# Patient Record
Sex: Male | Born: 1970 | Race: White | Hispanic: No | Marital: Married | Smoking: Current every day smoker
Health system: Southern US, Community
[De-identification: ages and names within clinical notes are randomized; demographics above are authoritative.]

## PROBLEM LIST (undated history)

## (undated) HISTORY — PX: FEMUR SURGERY: SHX943

## (undated) HISTORY — PX: INNER EAR SURGERY: SHX679

## (undated) HISTORY — PX: COLONOSCOPY: SHX174

---

## 2013-07-05 HISTORY — PX: HERNIA REPAIR: SHX51

## 2013-08-16 ENCOUNTER — Encounter (HOSPITAL_COMMUNITY)
Admission: EM | Disposition: A | Discharge status: Home / Self Care | Payer: Self-pay | Source: Home / Self Care | Attending: Emergency Medicine

## 2013-08-16 ENCOUNTER — Encounter (HOSPITAL_BASED_OUTPATIENT_CLINIC_OR_DEPARTMENT_OTHER): Payer: Self-pay | Admitting: Emergency Medicine

## 2013-08-16 ENCOUNTER — Emergency Department (HOSPITAL_BASED_OUTPATIENT_CLINIC_OR_DEPARTMENT_OTHER): Payer: Worker's Compensation

## 2013-08-16 ENCOUNTER — Inpatient Hospital Stay: Admit: 2013-08-16 | Payer: Self-pay | Admitting: Orthopedic Surgery

## 2013-08-16 ENCOUNTER — Ambulatory Visit (HOSPITAL_BASED_OUTPATIENT_CLINIC_OR_DEPARTMENT_OTHER)
Admission: EM | Admit: 2013-08-16 | Discharge: 2013-08-16 | Disposition: A | Discharge status: Home / Self Care | Payer: Worker's Compensation | Attending: Emergency Medicine | Admitting: Emergency Medicine

## 2013-08-16 ENCOUNTER — Emergency Department (HOSPITAL_COMMUNITY): Payer: Worker's Compensation | Admitting: Anesthesiology

## 2013-08-16 ENCOUNTER — Encounter (HOSPITAL_COMMUNITY): Payer: Worker's Compensation | Admitting: Anesthesiology

## 2013-08-16 DIAGNOSIS — W3189XA Contact with other specified machinery, initial encounter: Secondary | ICD-10-CM | POA: Insufficient documentation

## 2013-08-16 DIAGNOSIS — S67190A Crushing injury of right index finger, initial encounter: Secondary | ICD-10-CM

## 2013-08-16 DIAGNOSIS — Y9269 Other specified industrial and construction area as the place of occurrence of the external cause: Secondary | ICD-10-CM | POA: Insufficient documentation

## 2013-08-16 DIAGNOSIS — Y990 Civilian activity done for income or pay: Secondary | ICD-10-CM | POA: Insufficient documentation

## 2013-08-16 DIAGNOSIS — S6710XA Crushing injury of unspecified finger(s), initial encounter: Secondary | ICD-10-CM | POA: Insufficient documentation

## 2013-08-16 DIAGNOSIS — IMO0002 Reserved for concepts with insufficient information to code with codable children: Secondary | ICD-10-CM | POA: Insufficient documentation

## 2013-08-16 HISTORY — PX: AMPUTATION: SHX166

## 2013-08-16 LAB — BASIC METABOLIC PANEL
BUN: 11 mg/dL (ref 6–23)
CO2: 22 meq/L (ref 19–32)
Calcium: 9.7 mg/dL (ref 8.4–10.5)
Chloride: 103 mEq/L (ref 96–112)
Creatinine, Ser: 0.86 mg/dL (ref 0.50–1.35)
GFR calc Af Amer: >90 mL/min (ref >90)
GLUCOSE: 98 mg/dL (ref 70–99)
POTASSIUM: 4 meq/L (ref 3.7–5.3)
SODIUM: 143 meq/L (ref 137–147)

## 2013-08-16 LAB — CBC
HCT: 44.9 % (ref 39.0–52.0)
Hemoglobin: 16 g/dL (ref 13.0–17.0)
MCH: 32.7 pg (ref 26.0–34.0)
MCHC: 35.6 g/dL (ref 30.0–36.0)
MCV: 91.6 fL (ref 78.0–100.0)
Platelets: 232 10*3/uL (ref 150–400)
RBC: 4.9 MIL/uL (ref 4.22–5.81)
RDW: 12.9 % (ref 11.5–15.5)
WBC: 13.2 10*3/uL — ABNORMAL HIGH (ref 4.0–10.5)

## 2013-08-16 SURGERY — AMPUTATION DIGIT
Anesthesia: Monitor Anesthesia Care | Site: Finger | Laterality: Right

## 2013-08-16 MED ORDER — DEXTROSE 5 % IV SOLN
3.0000 g | INTRAVENOUS | Status: AC
  Administered 2013-08-16: 2 g via INTRAVENOUS

## 2013-08-16 MED ORDER — CEFAZOLIN SODIUM-DEXTROSE 2-3 GM-% IV SOLR
INTRAVENOUS | Status: AC
  Filled 2013-08-16: qty 50

## 2013-08-16 MED ORDER — LIDOCAINE HCL (CARDIAC) 20 MG/ML IV SOLN
INTRAVENOUS | Status: AC
  Filled 2013-08-16: qty 5

## 2013-08-16 MED ORDER — BUPIVACAINE HCL (PF) 0.25 % IJ SOLN
INTRAMUSCULAR | Status: AC
  Filled 2013-08-16: qty 30

## 2013-08-16 MED ORDER — LACTATED RINGERS IV SOLN
INTRAVENOUS | Status: DC | PRN
  Administered 2013-08-16: 17:00:00 via INTRAVENOUS

## 2013-08-16 MED ORDER — CHLORHEXIDINE GLUCONATE 4 % EX LIQD: 60.0000 mL | Freq: Once | CUTANEOUS | Status: DC

## 2013-08-16 MED ORDER — MIDAZOLAM HCL 2 MG/2ML IJ SOLN
INTRAMUSCULAR | Status: AC
  Filled 2013-08-16: qty 2

## 2013-08-16 MED ORDER — OXYCODONE HCL 5 MG PO TABS
5.0000 mg | ORAL_TABLET | Freq: Once | ORAL | Status: AC
  Administered 2013-08-16: 5 mg via ORAL

## 2013-08-16 MED ORDER — MIDAZOLAM HCL 5 MG/5ML IJ SOLN
INTRAMUSCULAR | Status: DC | PRN
  Administered 2013-08-16 (×2): 1 mg via INTRAVENOUS

## 2013-08-16 MED ORDER — SODIUM CHLORIDE 0.9 % IR SOLN
Status: DC | PRN
  Administered 2013-08-16: 2000 mL

## 2013-08-16 MED ORDER — FENTANYL CITRATE 0.05 MG/ML IJ SOLN
INTRAMUSCULAR | Status: AC
  Filled 2013-08-16: qty 5

## 2013-08-16 MED ORDER — CEFAZOLIN SODIUM 1-5 GM-% IV SOLN
1.0000 g | Freq: Once | INTRAVENOUS | Status: AC
  Administered 2013-08-16: 1 g via INTRAVENOUS
  Filled 2013-08-16: qty 50

## 2013-08-16 MED ORDER — HYDROMORPHONE HCL PF 1 MG/ML IJ SOLN
0.5000 mg | Freq: Once | INTRAMUSCULAR | Status: AC
  Administered 2013-08-16: 0.5 mg via INTRAVENOUS
  Filled 2013-08-16: qty 1

## 2013-08-16 MED ORDER — OXYCODONE-ACETAMINOPHEN 5-325 MG PO TABS: ORAL_TABLET | ORAL | Status: DC

## 2013-08-16 MED ORDER — PROPOFOL 10 MG/ML IV BOLUS
INTRAVENOUS | Status: AC
  Filled 2013-08-16: qty 20

## 2013-08-16 MED ORDER — SULFAMETHOXAZOLE-TRIMETHOPRIM 800-160 MG PO TABS: 1.0000 | ORAL_TABLET | Freq: Two times a day (BID) | ORAL | Status: DC

## 2013-08-16 MED ORDER — OXYCODONE HCL 5 MG PO TABS
ORAL_TABLET | ORAL | Status: AC
  Filled 2013-08-16: qty 1

## 2013-08-16 MED ORDER — PROPOFOL 10 MG/ML IV EMUL
INTRAVENOUS | Status: AC
  Filled 2013-08-16: qty 50

## 2013-08-16 MED ORDER — FENTANYL CITRATE 0.05 MG/ML IJ SOLN
INTRAMUSCULAR | Status: DC | PRN
  Administered 2013-08-16 (×5): 50 ug via INTRAVENOUS

## 2013-08-16 MED ORDER — BUPIVACAINE HCL (PF) 0.25 % IJ SOLN
INTRAMUSCULAR | Status: DC | PRN
  Administered 2013-08-16: 10 mL

## 2013-08-16 MED ORDER — LACTATED RINGERS IV SOLN
INTRAVENOUS | Status: DC
  Administered 2013-08-16: 17:00:00 via INTRAVENOUS

## 2013-08-16 MED ORDER — ONDANSETRON HCL 4 MG/2ML IJ SOLN
4.0000 mg | Freq: Once | INTRAMUSCULAR | Status: DC | PRN
Start: 2013-08-16 — End: 2013-08-16

## 2013-08-16 MED ORDER — PROPOFOL INFUSION 10 MG/ML OPTIME
INTRAVENOUS | Status: DC | PRN
  Administered 2013-08-16: 120 ug/kg/min via INTRAVENOUS

## 2013-08-16 MED ORDER — ROCURONIUM BROMIDE 50 MG/5ML IV SOLN
INTRAVENOUS | Status: AC
  Filled 2013-08-16: qty 1

## 2013-08-16 MED ORDER — ONDANSETRON HCL 4 MG/2ML IJ SOLN
INTRAMUSCULAR | Status: DC | PRN
  Administered 2013-08-16: 4 mg via INTRAVENOUS

## 2013-08-16 MED ORDER — HYDROMORPHONE HCL PF 1 MG/ML IJ SOLN: 0.2500 mg | INTRAMUSCULAR | Status: DC | PRN

## 2013-08-16 SURGICAL SUPPLY — 52 items
BANDAGE GAUZE ELAST BULKY 4 IN (GAUZE/BANDAGES/DRESSINGS) IMPLANT
BLADE LONG MED 31MMX9MM (MISCELLANEOUS)
BLADE LONG MED 31X9 (MISCELLANEOUS) IMPLANT
BNDG COHESIVE 1X5 TAN STRL LF (GAUZE/BANDAGES/DRESSINGS) ×3 IMPLANT
BNDG COHESIVE 3X5 TAN STRL LF (GAUZE/BANDAGES/DRESSINGS) IMPLANT
BNDG ESMARK 4X9 LF (GAUZE/BANDAGES/DRESSINGS) ×3 IMPLANT
CLOTH BEACON ORANGE TIMEOUT ST (SAFETY) IMPLANT
CORDS BIPOLAR (ELECTRODE) ×3 IMPLANT
COVER SURGICAL LIGHT HANDLE (MISCELLANEOUS) ×3 IMPLANT
CUFF TOURNIQUET SINGLE 18IN (TOURNIQUET CUFF) ×3 IMPLANT
CUFF TOURNIQUET SINGLE 24IN (TOURNIQUET CUFF) IMPLANT
DRAIN PENROSE 1/4X12 LTX STRL (WOUND CARE) ×3 IMPLANT
DRSG KUZMA FLUFF (GAUZE/BANDAGES/DRESSINGS) IMPLANT
DRSG PAD ABDOMINAL 8X10 ST (GAUZE/BANDAGES/DRESSINGS) ×3 IMPLANT
DURAPREP 26ML APPLICATOR (WOUND CARE) IMPLANT
GAUZE XEROFORM 1X8 LF (GAUZE/BANDAGES/DRESSINGS) ×3 IMPLANT
GLOVE BIO SURGEON STRL SZ 6.5 (GLOVE) IMPLANT
GLOVE BIO SURGEONS STRL SZ 6.5 (GLOVE)
GLOVE BIOGEL PI IND STRL 6.5 (GLOVE) ×1 IMPLANT
GLOVE BIOGEL PI IND STRL 7.0 (GLOVE) ×1 IMPLANT
GLOVE BIOGEL PI INDICATOR 6.5 (GLOVE) ×2
GLOVE BIOGEL PI INDICATOR 7.0 (GLOVE) ×2
GLOVE SURG ORTHO 8.0 STRL STRW (GLOVE) ×3 IMPLANT
GOWN BRE IMP PREV XXLGXLNG (GOWN DISPOSABLE) IMPLANT
GOWN PREVENTION PLUS XLARGE (GOWN DISPOSABLE) ×3 IMPLANT
GOWN STRL NON-REIN LRG LVL3 (GOWN DISPOSABLE) ×3 IMPLANT
KIT BASIN OR (CUSTOM PROCEDURE TRAY) ×3 IMPLANT
KIT ROOM TURNOVER OR (KITS) ×3 IMPLANT
MANIFOLD NEPTUNE II (INSTRUMENTS) IMPLANT
NEEDLE HYPO 25GX1X1/2 BEV (NEEDLE) ×3 IMPLANT
NS IRRIG 1000ML POUR BTL (IV SOLUTION) ×3 IMPLANT
PACK ORTHO EXTREMITY (CUSTOM PROCEDURE TRAY) ×3 IMPLANT
PAD ARMBOARD 7.5X6 YLW CONV (MISCELLANEOUS) ×6 IMPLANT
PAD CAST 4YDX4 CTTN HI CHSV (CAST SUPPLIES) IMPLANT
PADDING CAST COTTON 4X4 STRL (CAST SUPPLIES)
RUBBERBAND STERILE (MISCELLANEOUS) IMPLANT
SET CYSTO W/LG BORE CLAMP LF (SET/KITS/TRAYS/PACK) ×3 IMPLANT
SPECIMEN JAR SMALL (MISCELLANEOUS) IMPLANT
SPLINT FINGER (SOFTGOODS) ×3 IMPLANT
SPONGE GAUZE 4X4 12PLY (GAUZE/BANDAGES/DRESSINGS) ×3 IMPLANT
SPONGE SCRUB IODOPHOR (GAUZE/BANDAGES/DRESSINGS) ×3 IMPLANT
SUT ETHILON 5 0 PS 2 18 (SUTURE) IMPLANT
SUT MON AB 5-0 P3 18 (SUTURE) ×3 IMPLANT
SUT SILK 4 0 PS 2 (SUTURE) IMPLANT
SUT VICRYL 4-0 PS2 18IN ABS (SUTURE) IMPLANT
SYR CONTROL 10ML LL (SYRINGE) ×3 IMPLANT
TOWEL OR 17X24 6PK STRL BLUE (TOWEL DISPOSABLE) ×3 IMPLANT
TOWEL OR 17X26 10 PK STRL BLUE (TOWEL DISPOSABLE) ×3 IMPLANT
TUBE CONNECTING 12'X1/4 (SUCTIONS) ×1
TUBE CONNECTING 12X1/4 (SUCTIONS) ×2 IMPLANT
UNDERPAD 30X30 INCONTINENT (UNDERPADS AND DIAPERS) ×3 IMPLANT
WATER STERILE IRR 1000ML POUR (IV SOLUTION) IMPLANT

## 2013-08-16 NOTE — ED Notes (Signed)
Pt amb to room 5 with quick steady gait in nad. Pt reports crush injury at work, paper towel removed from right second digit to reveal a partial amputation of the tip, with moderate active bleeding noted. Pt rates pain at 4/10 "it just throbs a little bit."

## 2013-08-16 NOTE — Anesthesia Postprocedure Evaluation (Signed)
  Anesthesia Post-op Note  Patient: Gregory Peters  Procedure(s) Performed: Procedure(s): INDEX REVISION AMPUTATION DIGIT (Right)  Patient Location: PACU  Anesthesia Type:MAC  Level of Consciousness: awake, alert , oriented and patient cooperative  Airway and Oxygen Therapy: Patient Spontanous Breathing  Post-op Pain: none  Post-op Assessment: Post-op Vital signs reviewed, Patient's Cardiovascular Status Stable, Respiratory Function Stable, Patent Airway, No signs of Nausea or vomiting and Pain level controlled  Post-op Vital Signs: stable  Complications: No apparent anesthesia complications

## 2013-08-16 NOTE — Anesthesia Preprocedure Evaluation (Addendum)
Anesthesia Evaluation  Patient identified by MRN, date of birth, ID band Patient awake    Reviewed: Allergy & Precautions, H&P , NPO status , Patient's Chart, lab work & pertinent test results  Airway       Dental   Pulmonary          Cardiovascular     Neuro/Psych    GI/Hepatic   Endo/Other    Renal/GU      Musculoskeletal   Abdominal   Peds  Hematology   Anesthesia Other Findings   Reproductive/Obstetrics                           Anesthesia Physical Anesthesia Plan  ASA: I  Anesthesia Plan: MAC   Post-op Pain Management:    Induction: Intravenous  Airway Management Planned: Mask  Additional Equipment:   Intra-op Plan:   Post-operative Plan:   Informed Consent: I have reviewed the patients History and Physical, chart, labs and discussed the procedure including the risks, benefits and alternatives for the proposed anesthesia with the patient or authorized representative who has indicated his/her understanding and acceptance.     Plan Discussed with:   Anesthesia Plan Comments:         Anesthesia Quick Evaluation

## 2013-08-16 NOTE — Op Note (Signed)
[id number] 

## 2013-08-16 NOTE — Preoperative (Signed)
Beta Blockers   Reason not to administer Beta Blockers:Not Applicable 

## 2013-08-16 NOTE — ED Notes (Signed)
IV left in place per MD order for patient to be transferred to [facility].

## 2013-08-16 NOTE — Discharge Instructions (Signed)

## 2013-08-16 NOTE — H&P (Signed)
  Lana FishMatthew Rehman is an 43 y.o. male.   Chief Complaint: right index finger degloving HPI: 43 yo rhd male states he caught right index finger in rollers in machine at work this afternoon.  End of right index finger removed by rollers.  [facility] and [facility].  Reports no previous injury to right index and no other injury at this time.  History reviewed. No pertinent past medical history.  Past Surgical History  Procedure Laterality Date  . Femur surgery Right     Age 75  . Inner ear surgery Left   . Colonoscopy      History reviewed. No pertinent family history. Social History:  reports that he has never smoked. His smokeless tobacco use includes Chew. He reports that he drinks about 1.2 ounces of alcohol per week. He reports that he does not use illicit drugs.  Allergies: No Known Allergies  Medications Prior to Admission  Medication Sig Dispense Refill  . ibuprofen (ADVIL,MOTRIN) 200 MG tablet Take 200 mg by mouth every 6 (six) hours as needed.        No results found for this or any previous visit (from the past 48 hour(s)).  Dg Hand Complete Right  08/16/2013   CLINICAL DATA:  Crush injury to the right index finger.  EXAM: RIGHT HAND - COMPLETE 3+ VIEW  COMPARISON:  None.  FINDINGS: Amputation of the soft tissues of the right index finger tipped noted. Thin rim of soft tissues around the tuft of the distal phalanx. Questionable scalloping of the distal tuft may reflect a small fracture of the tip of the tuft.  No other acute bony findings. Scattered small degenerative subcortical cysts or geodes in the carpus.  IMPRESSION: 1. Index finger tip amputation, with some degloving of the soft tissues around the tuft. Possible tiny bony defect in the distal rim of the tuft may reflect a small fracture with loss of the distal rim fragment.   Electronically Signed   By: Herbie BaltimoreWalt  Liebkemann M.D.   On: 08/16/2013 14:51     A comprehensive review of systems was  negative.  Blood pressure 155/100, pulse 87, temperature 98.3 F (36.8 C), temperature source Oral, resp. rate 20, height 5\' 8"  (1.727 m), weight 285 lb (129.275 kg), SpO2 98.00%.  General appearance: alert, cooperative and appears stated age Head: Normocephalic, without obvious abnormality, atraumatic Neck: supple, symmetrical, trachea midline Resp: clear to auscultation bilaterally Cardio: regular rate and rhythm GI: non tender Extremities: intact sensation and capillary refill all digits except right index.  +epl/fpl/io.  right index with soft tissue loss from base of nail distally.  nail fold remains.  bone exposed.  no other wounds.  +F/E at dip of index. Pulses: 2+ and symmetric Skin: Skin color, texture, turgor normal. No rashes or lesions Neurologic: Grossly normal Incision/Wound: As above  Assessment/Plan Right index finger amputation/degloving injury.  Recommend revision amputation in OR.  Risks, benefits, and alternatives of surgery were discussed and the patient agrees with the plan of care.   Joniah Bednarski R 08/16/2013, 5:38 PM

## 2013-08-16 NOTE — ED Provider Notes (Signed)
CSN: [id number]     Arrival date & time 08/16/13  1352 History   First MD Initiated Contact with Patient 08/16/13 1413     Chief Complaint  Patient presents with  . partial amputation, crush injury      (Consider location/radiation/quality/duration/timing/severity/associated sxs/prior Treatment) Patient is a 43 y.o. male presenting with hand injury. The history is provided by the patient.  Hand Injury Location:  Finger Time since incident:  1 hour Injury: yes   Mechanism of injury: crush   Finger location:  R index finger Pain details:    Quality:  Aching   Radiates to:  Does not radiate   Severity:  Mild   Onset quality:  Sudden   Duration:  1 hour   Timing:  Constant   Progression:  Unchanged Chronicity:  New Handedness:  Right-handed Dislocation: no   Foreign body present:  No foreign bodies Prior injury to area:  No Relieved by:  Nothing Worsened by:  Nothing tried Ineffective treatments:  None tried Associated symptoms: no fever and no neck pain     History reviewed. No pertinent past medical history. History reviewed. No pertinent past surgical history. History reviewed. No pertinent family history. History  Substance Use Topics  . Smoking status: Never Smoker   . Smokeless tobacco: Current User  . Alcohol Use: Not on file    Review of Systems  Constitutional: Negative for fever.  HENT: Negative for drooling and rhinorrhea.   Eyes: Negative for pain.  Respiratory: Negative for cough and shortness of breath.   Cardiovascular: Negative for chest pain and leg swelling.  Gastrointestinal: Negative for nausea, vomiting, abdominal pain and diarrhea.  Genitourinary: Negative for dysuria and hematuria.  Musculoskeletal: Negative for gait problem and neck pain.  Skin: Negative for color change.  Neurological: Negative for numbness and headaches.  Hematological: Negative for adenopathy.  Psychiatric/Behavioral: Negative for behavioral problems.  All other  systems reviewed and are negative.      Allergies  Review of patient's allergies indicates no known allergies.  Home Medications  No current outpatient prescriptions on file. There were no vitals taken for this visit. Physical Exam  Nursing note and vitals reviewed. Constitutional: He is oriented to person, place, and time. He appears well-developed and well-nourished.  HENT:  Head: Normocephalic and atraumatic.  Right Ear: External ear normal.  Left Ear: External ear normal.  Nose: Nose normal.  Mouth/Throat: Oropharynx is clear and moist. No oropharyngeal exudate.  Eyes: Conjunctivae and EOM are normal. Pupils are equal, round, and reactive to light.  Neck: Normal range of motion. Neck supple.  Cardiovascular: Normal rate, regular rhythm, normal heart sounds and intact distal pulses.  Exam reveals no gallop and no friction rub.   No murmur heard. Pulmonary/Chest: Effort normal and breath sounds normal. No respiratory distress. He has no wheezes.  Abdominal: Soft. Bowel sounds are normal. He exhibits no distension. There is no tenderness. There is no rebound and no guarding.  Musculoskeletal: Normal range of motion. He exhibits no edema and no tenderness.       Hands: Sensation and motor skills intact in the right hand.  2+ distal pulses in the right upper extremity.  Neurological: He is alert and oriented to person, place, and time.  Skin: Skin is warm and dry.  Psychiatric: He has a normal mood and affect. His behavior is normal.    ED Course  Procedures (including critical care time) Labs Review Labs Reviewed - No data to display Imaging Review No  results found.  EKG Interpretation   None           MDM   Final diagnoses:  Crushing injury of right index finger    2:21 PM 43 y.o. male who presents with a partial amputation injury of his right index finger which occurred prior to arrival. The patient states that he was wearing a Kevlar gloves and got his  finger stuck in the rolling pins of a machine leading to a crush/amputation of the distal aspect of the digit. He is afebrile here and vital signs are unremarkable. He currently has 4/10 pain. He refuses any pain medicine currently. He is right handed. He states that his tetanus is up-to-date within the last 4 years.  Will give ancef 1g IV. Possible tuft fracture noted.   4:00 PM Discussed w/ Dr. Merlyn Lot who will see the patient at [facility] ED. Informed pod B physician Dr. Redgie Grayer. Pt is reliable and wound has been hemostatic since he has been here. Will dress wound and send pt via private vehicle to [facility].   Junius Argyle, MD 08/16/13 [phone]

## 2013-08-16 NOTE — Brief Op Note (Signed)
08/16/2013  6:43 PM  PATIENT:  Gregory FishMatthew Peters  43 y.o. male  PRE-OPERATIVE DIAGNOSIS:  Right index finger amputation /degloving  POST-OPERATIVE DIAGNOSIS:  Right index finger amputation/degloving  PROCEDURE:  Procedure(s): INDEX REVISION AMPUTATION DIGIT (Right)  SURGEON:  Surgeon(s) and Role:    * Tami RibasKevin R Jaelin Devincentis, MD - Primary  PHYSICIAN ASSISTANT:   ASSISTANTS: none   ANESTHESIA:   local and MAC  EBL:  Total I/O In: 700 [I.V.:700] Out: -   BLOOD ADMINISTERED:none  DRAINS: none   LOCAL MEDICATIONS USED:  MARCAINE     SPECIMEN:  No Specimen  DISPOSITION OF SPECIMEN:  N/A  COUNTS:  YES  TOURNIQUET:   Total Tourniquet Time Documented: area (Right) - 27 minutes Total: area (Right) - 27 minutes   DICTATION: .Other Dictation: Dictation Number [id number]  PLAN OF CARE: Discharge to home after PACU  PATIENT DISPOSITION:  PACU - hemodynamically stable.

## 2013-08-16 NOTE — Transfer of Care (Signed)
Immediate Anesthesia Transfer of Care Note  Patient: Gregory FishMatthew Peters  Procedure(s) Performed: Procedure(s): INDEX REVISION AMPUTATION DIGIT (Right)  Patient Location: PACU  Anesthesia Type:MAC  Level of Consciousness: awake, alert  and oriented  Airway & Oxygen Therapy: Patient Spontanous Breathing and Patient connected to nasal cannula oxygen  Post-op Assessment: Report given to PACU RN and Post -op Vital signs reviewed and stable  Post vital signs: Reviewed and stable  Complications: No apparent anesthesia complications

## 2013-08-17 ENCOUNTER — Encounter (HOSPITAL_COMMUNITY): Payer: Self-pay | Admitting: Orthopedic Surgery

## 2013-08-17 NOTE — Op Note (Signed)
NAMParticia Peters:  Few, Jeanpierre                ACCOUNT NO.:  [id number]  MEDICAL RECORD NO.:  [id number]  LOCATION:  [facility]                         FACILITY:  [facility]  PHYSICIAN:  Betha LoaKevin Izabellah Dadisman, MD        DATE OF BIRTH:  [birth year]  DATE OF PROCEDURE:  08/16/2013 DATE OF DISCHARGE:  08/16/2013                              OPERATIVE REPORT   PREOPERATIVE DIAGNOSIS:  Right index finger amputation/degloving.  POSTOPERATIVE DIAGNOSIS:  Right index finger amputation/degloving.  PROCEDURE:  Revision amputation right index finger.  SURGEON:  Betha LoaKevin Paisly Fingerhut, MD  ASSISTANT:  None.  ANESTHESIA:  MAC with digital block.  IV FLUIDS:  Per Anesthesia flow sheet.  ESTIMATED BLOOD LOSS:  Minimal.  COMPLICATIONS:  None.  SPECIMENS:  None.  TOURNIQUET TIME:  Twenty seven minutes.  DISPOSITION:  Stable to PACU.  INDICATIONS:  Mr. Gregory Peters is a 43 year old right-hand dominant male who worked today, got his right index finger caught in a machine with a rollers on it.  This pinched off the tip of the finger.  [facility], where he was evaluated and found to have a amputation/degloving injury of the distal aspect of the right index finger.  [facility].  I discussed with Mr. Gregory Peters and his significant other the nature of the injury, recommended revision amputation in the operating room.  Risks, benefits, and alternatives of surgery were discussed including risk of blood loss, infection, damage to nerves, vessels, tendons, ligaments, bone; failure of surgery; need for additional surgery, complications with wound healing, continued pain, and weakness.  He voiced understanding of these risks and elected to proceed.  OPERATIVE COURSE:  After being identified preoperatively by myself, the patient and I agreed upon procedure and site procedure.  Surgical site was marked.  The risks, benefits, and alternatives of surgery were reviewed and wished to proceed.   Surgical consent had been signed.  His tetanus has been within the last 4 years.  He was given IV Ancef as preoperative antibiotic coverage.  He was transferred to the operating room and placed on the operating table in supine position with the right upper extremity on arm board.  General MAC anesthesia was induced by anesthesiologist.  The right upper extremity was prepped and draped in normal sterile orthopedic fashion.  Surgical pause was performed between surgeons, anesthesia, operating staff, and all were in agreement as to the patient, procedure, and site of procedure.  Digital block was performed with 10 mL of 0.25% plain Marcaine to aid in postoperative analgesia and as anesthesia for the case.  Once adequate digital block had been obtained, a Penrose drain was placed in the proximal aspect of the finger as a tourniquet was up for 27 minutes.  The wound was evaluated.  There was soft tissue loss both volarly and dorsally.  Soft tissue loss volarly was to approximate the DIP flexion crease.  There was some subcutaneous tissue remaining.  The radial and ulnar neurovascular bundles were identified and treated with bipolar electrocautery and are allowed to retract.  The wound was copiously irrigated with 2000 mL of sterile saline by cysto tubing.  The  nail fold was removed sharply with the knife.  The bone was shortened with a bone cutters and rongeurs to the point that soft tissue coverage could be obtained.  This allowed preservation of the FDP and extensor tendon insertions.  It was felt that this would allow increased functional use of the finger.  The soft tissues were mobilized to allow soft tissue coverage over the end of the bone.  The wound was closed with 5-0 Monocryl suture in interrupted fashion.  Good bony coverage had been obtained.  The wound was dressed with sterile Xeroform and, 4x4 and wrapped with a Coban dressing lightly.  An AlumaFoam splint was placed and wrapped  lightly with Coban dressing as well.  The Penrose drain was removed.  The operative drapes were broken down.  The patient was awoken from his anesthesia safely.  He was transferred back to stretcher and taken to PACU in stable condition.  I will see him back in the office in 1 week for postoperative followup.  I will give him Percocet 5/325, 1-2 p.o. q.6 hours p.r.n. pain, dispensed #40, and Bactrim DS 1 p.o. b.i.d. x7 days.     Betha Loa, MD     KK/MEDQ  D:  08/16/2013  T:  08/17/2013  Job:  [id number]

## 2014-05-17 ENCOUNTER — Encounter: Payer: Self-pay | Admitting: Physician Assistant

## 2014-05-17 ENCOUNTER — Ambulatory Visit (INDEPENDENT_AMBULATORY_CARE_PROVIDER_SITE_OTHER): Payer: Managed Care, Other (non HMO) | Admitting: Physician Assistant

## 2014-05-17 VITALS — BP 123/65 | HR 85 | Ht 69.0 in | Wt 292.0 lb

## 2014-05-17 DIAGNOSIS — Z131 Encounter for screening for diabetes mellitus: Secondary | ICD-10-CM

## 2014-05-17 DIAGNOSIS — Z13220 Encounter for screening for lipoid disorders: Secondary | ICD-10-CM

## 2014-05-17 DIAGNOSIS — F17200 Nicotine dependence, unspecified, uncomplicated: Secondary | ICD-10-CM

## 2014-05-17 DIAGNOSIS — E669 Obesity, unspecified: Secondary | ICD-10-CM

## 2014-05-17 DIAGNOSIS — R5383 Other fatigue: Secondary | ICD-10-CM

## 2014-05-20 DIAGNOSIS — F17200 Nicotine dependence, unspecified, uncomplicated: Secondary | ICD-10-CM | POA: Insufficient documentation

## 2014-05-20 DIAGNOSIS — E669 Obesity, unspecified: Secondary | ICD-10-CM | POA: Insufficient documentation

## 2014-05-20 DIAGNOSIS — R5383 Other fatigue: Secondary | ICD-10-CM | POA: Insufficient documentation

## 2014-05-20 NOTE — Progress Notes (Signed)
   Subjective:    Patient ID: Gregory Peters, male    DOB: 07/11/1970, 43 y.o.   MRN: [id number]  HPI Patient is a 43 year old male who presents to the clinic to establish care.  Patient has no ongoing past medical history and on no current medications.  .. Family History  Problem Relation Age of Onset  . Cancer Father     bladder  . Diabetes Father    .[name] Kitchen. History   Social History  . Marital Status: Married    Spouse Name: N/A    Number of Children: N/A  . Years of Education: N/A   Occupational History  . Not on file.   Social History Main Topics  . Smoking status: Current Every Day Smoker  . Smokeless tobacco: Current User    Types: Chew  . Alcohol Use: 1.2 oz/week    2 Cans of beer per week     Comment: daily  . Drug Use: No  . Sexual Activity: Yes   Other Topics Concern  . Not on file   Social History Narrative   Patient is occurring every day smoker and does not exercise.  Patient is concerned due to his ongoing fatigue and weight. He admits he does not eat right does not exercise.   Review of Systems  All other systems reviewed and are negative.      Objective:   Physical Exam  Constitutional: He is oriented to person, place, and time. He appears well-developed and well-nourished.  Obese.   HENT:  Head: Normocephalic and atraumatic.  Neck: Normal range of motion. Neck supple. No thyromegaly present.  Cardiovascular: Normal rate, regular rhythm and normal heart sounds.   Pulmonary/Chest: Effort normal and breath sounds normal. He has no wheezes.  Neurological: He is alert and oriented to person, place, and time.  Skin: Skin is dry.  Psychiatric: He has a normal mood and affect. His behavior is normal.          Assessment & Plan:  Fatigue/obese- will order labs to evaluate fatigue. Certainly being overweight and not active could be causing some fatigue. Cannot rule out sleep apnea due to size. Patient would like to try to lose weight and see if  some of the symptoms improve. Discuss going on phentermine for a trial. He would like to try on his own for a few months and then consider medication.  Tobacco dependence-discuss quitting smoking patient is not opposed to the idea but not ready to quit right the second.

## 2014-10-25 ENCOUNTER — Ambulatory Visit: Payer: Self-pay | Admitting: Physician Assistant

## 2015-10-13 ENCOUNTER — Encounter: Payer: Self-pay | Admitting: Physician Assistant

## 2015-10-13 ENCOUNTER — Ambulatory Visit (INDEPENDENT_AMBULATORY_CARE_PROVIDER_SITE_OTHER): Payer: BLUE CROSS/BLUE SHIELD | Admitting: Physician Assistant

## 2015-10-13 VITALS — BP 119/64 | HR 67 | Ht 69.0 in | Wt 273.0 lb

## 2015-10-13 DIAGNOSIS — M545 Low back pain, unspecified: Secondary | ICD-10-CM

## 2015-10-13 MED ORDER — CYCLOBENZAPRINE HCL 10 MG PO TABS: 10.0000 mg | ORAL_TABLET | Freq: Three times a day (TID) | ORAL | Status: DC | PRN

## 2015-10-13 MED ORDER — KETOROLAC TROMETHAMINE 60 MG/2ML IM SOLN
60.0000 mg | Freq: Once | INTRAMUSCULAR | Status: AC
  Administered 2015-10-13: 60 mg via INTRAMUSCULAR

## 2015-10-13 MED ORDER — NAPROXEN-ESOMEPRAZOLE 500-20 MG PO TBEC: 1.0000 | DELAYED_RELEASE_TABLET | Freq: Two times a day (BID) | ORAL | Status: DC | PRN

## 2015-10-13 NOTE — Patient Instructions (Signed)
biofreeze

## 2015-10-13 NOTE — Progress Notes (Signed)
   Subjective:    Patient ID: Gregory FishMatthew Stines, male    DOB: 01/17/1971, 45 y.o.   MRN: [id number]  HPI  Pt is a 45 yo male who presents to the clinic with right low to mid back pain that does not radiate for the last 3 days. Hx of muscle tightness, spasms and pain that comes and goes. Usually ibuprofen helps signifcantly and he gets better. This injury has lasted longer. Denies any known trauma. No recent lifting or pulling. Does not exercise. Happened after twisting wrong. Twisting movements make worse. Not bowel or bladder dysfunction or saddle anesthesia. Ibuprofen does help. No fever, chills, or urinary symptoms.    Review of Systems    see HPI. Objective:   Physical Exam  Constitutional: He appears well-developed and well-nourished.  Pulmonary/Chest: Effort normal and breath sounds normal.  NO CVA tenderness.   Musculoskeletal:  Full ROM at waist with little discomfort.  Pain with twisting motion and side to side.  No tenderness over spine to palpation but tenderness over right lower rhomboid,latissimus dorsi and into lower back.  Negative straight leg test bilaterally.  Normal 5/5 strength of bilateral legs.   Psychiatric: He has a normal mood and affect. His behavior is normal.          Assessment & Plan:  Right low back pain- suspect muscle spasm/sprain. toradol 60mg  given in office today. Sample of vimovo with rx sent to pharmacy. Flexeril as needed. Discussed heat with exercises given in office today and then ice and put biofreeze over painful areas. Follow up if worsening or not improving.

## 2015-10-15 DIAGNOSIS — M545 Low back pain, unspecified: Secondary | ICD-10-CM | POA: Insufficient documentation

## 2016-07-30 ENCOUNTER — Encounter: Payer: Self-pay | Admitting: Family Medicine

## 2016-07-30 ENCOUNTER — Ambulatory Visit (INDEPENDENT_AMBULATORY_CARE_PROVIDER_SITE_OTHER): Payer: Worker's Compensation

## 2016-07-30 ENCOUNTER — Ambulatory Visit (INDEPENDENT_AMBULATORY_CARE_PROVIDER_SITE_OTHER): Payer: 59 | Admitting: Family Medicine

## 2016-07-30 DIAGNOSIS — S59901A Unspecified injury of right elbow, initial encounter: Secondary | ICD-10-CM | POA: Insufficient documentation

## 2016-07-30 DIAGNOSIS — S46211A Strain of muscle, fascia and tendon of other parts of biceps, right arm, initial encounter: Secondary | ICD-10-CM | POA: Diagnosis not present

## 2016-07-30 DIAGNOSIS — X500XXA Overexertion from strenuous movement or load, initial encounter: Secondary | ICD-10-CM

## 2016-07-30 DIAGNOSIS — Z77018 Contact with and (suspected) exposure to other hazardous metals: Secondary | ICD-10-CM | POA: Diagnosis not present

## 2016-07-30 NOTE — Progress Notes (Signed)
   Subjective:    I'm seeing this patient as a consultation for:  Gregory GawJade Breeback, PA-C   CC: Right elbow injury  HPI: Patient has significant right anterior elbow pain. He was lifting a heavy object at home and felt a pop. He notes intense pain at the anterior elbow and has swelling on his right upper arm. He is concerned that he may have ruptured his distal biceps tendon. He notes significant pain with elbow extension, resisted elbow flexion and supination. He has not tried any medications yet. He has used a shoulder sling but tends to help a little. The injury occurred yesterday. He is right-hand dominant and works as a Haematologiststeel worker. He thinks he may have had exposure to metal in his eyes before.  Past medical history, Surgical history, Family history not pertinant except as noted below, Social history, Allergies, and medications have been entered into the medical record, reviewed, and no changes needed.   Review of Systems: No headache, visual changes, nausea, vomiting, diarrhea, constipation, dizziness, abdominal pain, skin rash, fevers, chills, night sweats, weight loss, swollen lymph nodes, body aches, joint swelling, muscle aches, chest pain, shortness of breath, mood changes, visual or auditory hallucinations.   Objective:    Vitals:   07/30/16 1050  BP: 117/61  Pulse: 76   General: Well Developed, well nourished, and in no acute distress.  Neuro/Psych: Alert and oriented x3, extra-ocular muscles intact, able to move all 4 extremities, sensation grossly intact. Skin: Warm and dry, no rashes noted.  Respiratory: Not using accessory muscles, speaking in full sentences, trachea midline.  Cardiovascular: Pulses palpable, no extremity edema. Abdomen: Does not appear distended. MSK: Right elbow with defect visible at the distal biceps and retracted large swelling at the proximal biceps. Tender to palpation at the anterior elbow with absent an abnormal TEST. Patient has significant pain  with elbow extension and resisted elbow flexion and supination. Range of motion is approximately 70-130. Pulses capillary refill and sensation are intact distally.  X-ray right elbow and foreign body eyes are unremarkable to my interpretation. Awaiting formal radiology review  No results found for this or any previous visit (from the past 24 hour(s)). No results found.  Impression and Recommendations:    Assessment and Plan: 46 y.o. male with Suspected right distal biceps rupture. Patient was placed in a posterior arm splint and will be scheduled for MRI as soon as possible.. I will contact patient when MRI results are available I will discuss treatment options.   Discussed warning signs or symptoms. Please see discharge instructions. Patient expresses understanding.

## 2016-07-30 NOTE — Patient Instructions (Signed)
Thank you for coming in today. I suspect distal biceps tendon tear.  You should hear soon about MRI time Saturday.  Follow up with me a few days after MRI.  I will contact you sooner if needed.    Biceps Tendon Disruption (Distal) The distal biceps tendon is a strong cord of tissue that connects the biceps muscle, on the front of the upper arm, to a bone (radius) in the elbow. A distal biceps tendon disruption can interfere with the ability to turn the hand palm-up (supination) and bend (flex) the elbow. This is an uncommon injury that may include one or both of the following:  A complete tear (rupture) in the tendon, causing the tendon to completely separate from the radius. When this happens, the biceps muscle pulls up (retracts) toward the shoulder.  A partial tear in the tendon that causes the tendon to partially separate from the radius. This is less common than a tendon rupture. This condition is commonly treated with surgery, and recovery may take 4-8 months. Your health care provider will decide when it is safe for you to return to contact sports. What are the causes? This condition is usually caused by suddenly straightening the elbow while the biceps muscle is tightened (contracted). This could happen, for example, while lifting or carrying a heavy object that suddenly falls or shifts position. What increases the risk? The following factors may increase your risk of developing this condition:  Being a man who is 89-108 years old.  Smoking.  Using steroids. What are the signs or symptoms? Symptoms of this condition may include:  Feeling a "pop" in the elbow at the time of injury.  Pain, swelling, and bruising in the front of the elbow.  Weakness in the arm when doing certain movements, such as:  Lifting or carrying objects.  Rotating the forearm, such as when you turn a screwdriver.  Bending the elbow.  A bulge in the upper arm. You may feel this in the front of the  arm, the inside of the arm, or both.  Limited range of motion of the elbow and forearm. How is this diagnosed? This condition is diagnosed based on your symptoms, your medical history, and a physical exam. Your health care provider may test your range of motion by having you do arm movements. You may have tests, including:  X-rays.  Ultrasound. This uses sound waves to make an image of the affected area.  MRI. How is this treated? Treatment for this condition usually includes one or more of the following:  Resting from sports and intense physical activity.  Physical therapy.  Surgery to reattach your tendon to your radius. This is the most common treatment method. Your elbow will be kept in place (immobilized) with a cast immediately after surgery.  A splint or brace to immobilize your elbow. If you had surgery and you received a cast after surgery, you will get a splint or brace to replace your cast when you start physical therapy. In some cases, no treatment may be needed for this condition. Follow these instructions at home: If you have a splint or brace:  Wear the splint or brace as told by your health care provider. Remove it only as told by your health care provider.  Loosen the splint or brace if your fingers tingle, become numb, or turn cold and blue.  Do not let your splint or brace get wet if it is not waterproof.  If you have a splint or brace,  do not take baths, swim, or use a hot tub until your health care provider approves. Ask your health care provider if you can take showers. You may only be allowed to take sponge baths for bathing.  If your splint or brace is not waterproof, cover it with a watertight covering when you take a bath or a shower.  Keep the splint or brace clean. If you have a cast:  Do not stick anything inside the cast to scratch your skin. Doing that increases your risk of infection.  Check the skin around the cast every day. Tell your health  care provider about any concerns.  You may put lotion on dry skin around the edges of the cast. Do not put lotion on the skin underneath the cast.  Keep the cast clean.  If the cast is not waterproof:  Do not let it get wet.  Cover it with a watertight covering when you take a bath or a shower. Managing pain, stiffness, and swelling  If directed, put ice on the injured area:  Put ice in a plastic bag.  Place a towel between your skin and the bag.  Leave the ice on for 20 minutes, 2-3 times a day.  If directed, apply heat to the affected area before you exercise or as often as told by your health care provider. Use the heat source that your health care provider recommends, such as a moist heat pack or a heating pad.  Place a towel between your skin and the heat source.  Leave the heat on for 20-30 minutes.  Remove the heat if your skin turns bright red. This is especially important if you are unable to feel pain, heat, or cold. You may have a greater risk of getting burned.  Move your fingers often to avoid stiffness and to lessen swelling.  Raise (elevate) the injured area above the level of your heart while you are sitting or lying down. Driving  Do not drive or operate heavy machinery while taking prescription pain medicine.  Ask your health care provider when it is safe to drive if you have a cast, brace, splint, or sling on your arm. Activity  Return to your normal activities as told by your health care provider. Ask your health care provider what activities are safe for you.  Avoid activities that cause pain or make your condition worse.  Do not participate in contact sports until your health care provider approves.  Do not lift or carry anything with your injured arm until your health care provider approves.  Do exercises as told by your health care provider. Safety  Do not use the affected limb to support your body weight until your health care provider says  that you can. General instructions  Take over-the-counter and prescription medicines only as told by your health care provider.  If you have a cast or splint, do not put pressure on any part of the cast or splint until it is fully hardened. This may take several hours.  Keep all follow-up visits as told by your health care provider. This is important. Contact a health care provider if:  Your cast, splint, or brace causes pain or numbness. Get help right away if:  You develop severe pain.  You develop numbness or tingling in your hand.  Your hand feels unusually cold.  Your fingernails turn a dark color, such as blue or gray. This information is not intended to replace advice given to you by your health  care provider. Make sure you discuss any questions you have with your health care provider. Document Released: 06/21/2005 Document Revised: 02/26/2016 Document Reviewed: 03/16/2015 Elsevier Interactive Patient Education  2017 [employer]    Cast or Splint Care Casts and splints support injured limbs and keep bones from moving while they heal.  HOME CARE  Keep the cast or splint uncovered during the drying period.  A plaster cast can take 24 to 48 hours to dry.  A fiberglass cast will dry in less than 1 hour.  Do not rest the cast on anything harder than a pillow for 24 hours.  Do not put weight on your injured limb. Do not put pressure on the cast. Wait for your doctor's approval.  Keep the cast or splint dry.  Cover the cast or splint with a plastic bag during baths or wet weather.  If you have a cast over your chest and belly (trunk), take sponge baths until the cast is taken off.  If your cast gets wet, dry it with a towel or blow dryer. Use the cool setting on the blow dryer.  Keep your cast or splint clean. Wash a dirty cast with a damp cloth.  Do not put any objects under your cast or splint.  Do not scratch the skin under the cast with an object. If itching  is a problem, use a blow dryer on a cool setting over the itchy area.  Do not trim or cut your cast.  Do not take out the padding from inside your cast.  Exercise your joints near the cast as told by your doctor.  Raise (elevate) your injured limb on 1 or 2 pillows for the first 1 to 3 days. GET HELP IF:  Your cast or splint cracks.  Your cast or splint is too tight or too loose.  You itch badly under the cast.  Your cast gets wet or has a soft spot.  You have a bad smell coming from the cast.  You get an object stuck under the cast.  Your skin around the cast becomes red or sore.  You have new or more pain after the cast is put on. GET HELP RIGHT AWAY IF:  You have fluid leaking through the cast.  You cannot move your fingers or toes.  Your fingers or toes turn blue or white or are cool, painful, or puffy (swollen).  You have tingling or lose feeling (numbness) around the injured area.  You have bad pain or pressure under the cast.  You have trouble breathing or have shortness of breath.  You have chest pain. This information is not intended to replace advice given to you by your health care provider. Make sure you discuss any questions you have with your health care provider. Document Released: 10/21/2010 Document Revised: 02/21/2013 Document Reviewed: 12/28/2012 Elsevier Interactive Patient Education  2017 [employer]

## 2016-07-31 ENCOUNTER — Ambulatory Visit (HOSPITAL_BASED_OUTPATIENT_CLINIC_OR_DEPARTMENT_OTHER)
Admission: RE | Admit: 2016-07-31 | Discharge: 2016-07-31 | Disposition: A | Discharge status: Home / Self Care | Payer: Worker's Compensation | Source: Ambulatory Visit | Attending: Family Medicine | Admitting: Family Medicine

## 2016-07-31 DIAGNOSIS — S46111A Strain of muscle, fascia and tendon of long head of biceps, right arm, initial encounter: Secondary | ICD-10-CM | POA: Insufficient documentation

## 2016-07-31 DIAGNOSIS — X58XXXA Exposure to other specified factors, initial encounter: Secondary | ICD-10-CM | POA: Insufficient documentation

## 2016-07-31 DIAGNOSIS — S59901A Unspecified injury of right elbow, initial encounter: Secondary | ICD-10-CM | POA: Insufficient documentation

## 2016-08-02 ENCOUNTER — Telehealth: Payer: Self-pay | Admitting: Family Medicine

## 2016-08-02 DIAGNOSIS — S46211A Strain of muscle, fascia and tendon of other parts of biceps, right arm, initial encounter: Secondary | ICD-10-CM

## 2016-08-02 DIAGNOSIS — S46219A Strain of muscle, fascia and tendon of other parts of biceps, unspecified arm, initial encounter: Secondary | ICD-10-CM | POA: Insufficient documentation

## 2016-08-02 MED ORDER — TRAMADOL HCL 50 MG PO TABS: 50.0000 mg | ORAL_TABLET | Freq: Three times a day (TID) | ORAL | 0 refills | Status: DC | PRN

## 2016-08-02 MED ORDER — BACLOFEN 10 MG PO TABS: 10.0000 mg | ORAL_TABLET | Freq: Three times a day (TID) | ORAL | 0 refills | Status: DC

## 2016-08-02 NOTE — Telephone Encounter (Signed)
I called Mr Gregory Peters about his MRI results. He notes worse pain after moving his arm for the MRI.  Plan to refer to Dr Ave Filterhandler and rx Tramadol and Baclofen.  F/u PRN.

## 2018-05-09 ENCOUNTER — Emergency Department (INDEPENDENT_AMBULATORY_CARE_PROVIDER_SITE_OTHER)
Admission: EM | Admit: 2018-05-09 | Discharge: 2018-05-09 | Disposition: A | Discharge status: Home / Self Care | Payer: BLUE CROSS/BLUE SHIELD | Source: Home / Self Care | Attending: Family Medicine | Admitting: Family Medicine

## 2018-05-09 ENCOUNTER — Encounter: Payer: Self-pay | Admitting: Emergency Medicine

## 2018-05-09 DIAGNOSIS — L03115 Cellulitis of right lower limb: Secondary | ICD-10-CM | POA: Diagnosis not present

## 2018-05-09 LAB — POCT CBC W AUTO DIFF (K'VILLE URGENT CARE)

## 2018-05-09 MED ORDER — CLINDAMYCIN HCL 300 MG PO CAPS: ORAL_CAPSULE | ORAL | 0 refills | Status: DC

## 2018-05-09 MED ORDER — CEFTRIAXONE SODIUM 1 G IJ SOLR
1000.0000 mg | Freq: Once | INTRAMUSCULAR | Status: AC
  Administered 2018-05-09: 1000 mg via INTRAMUSCULAR

## 2018-05-09 NOTE — ED Provider Notes (Signed)
[facility]    CSN: [id number] Arrival date & time: 05/09/18  1509     History   Chief Complaint Chief Complaint  Patient presents with  . Knee Pain    HPI Gregory Peters is a 47 y.o. male.   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  Three days ago he began to develop soreness and mild swelling in his right anterior knee.  Yesterday he developed increased swelling with pain, warmth and anterior redness.  Last night he had sweats.  He feels well otherwise.  The history is provided by the patient.  Knee Pain  Location:  Knee Time since incident:  3 days Injury: no   Knee location:  R knee Pain details:    Quality:  Aching   Radiates to:  Does not radiate   Severity:  Moderate   Onset quality:  Gradual   Duration:  3 days   Timing:  Constant   Progression:  Worsening Chronicity:  New Prior injury to area:  No Relieved by:  Nothing Worsened by:  Flexion Ineffective treatments: Icy hot. Associated symptoms: decreased ROM, stiffness and swelling   Associated symptoms: no fatigue, no fever, no muscle weakness, no numbness and no tingling     History reviewed. No pertinent past medical history.  Patient Active Problem List   Diagnosis Date Noted  . Rupture of distal biceps tendon 08/02/2016  . Elbow injury, right, initial encounter 07/30/2016  . Right-sided low back pain without sciatica 10/15/2015  . Tobacco dependence 05/20/2014  . Obese 05/20/2014  . Other fatigue 05/20/2014  . Crushing injury of right index finger 08/16/2013    Past Surgical History:  Procedure Laterality Date  . AMPUTATION Right 08/16/2013   Procedure: INDEX REVISION AMPUTATION DIGIT;  Surgeon: Tami Ribas, MD;  Location: [facility] OR;  Service: Orthopedics;  Laterality: Right;  . COLONOSCOPY    . FEMUR SURGERY Right    Age 89  . HERNIA REPAIR  2015  . INNER EAR SURGERY Left        Home Medications    Prior to Admission medications   Medication Sig Start Date End Date  Taking? Authorizing Provider  ibuprofen (ADVIL,MOTRIN) 200 MG tablet Take 200 mg by mouth every 6 (six) hours as needed.   Yes [provider]  baclofen (LIORESAL) 10 MG tablet Take 1 tablet (10 mg total) by mouth 3 (three) times daily. 08/02/16   Rodolph Bong, MD  clindamycin (CLEOCIN) 300 MG capsule Take one cap by mouth every 8 hours 05/09/18   Lattie Haw, MD  cyclobenzaprine (FLEXERIL) 10 MG tablet Take 1 tablet (10 mg total) by mouth 3 (three) times daily as needed for muscle spasms. 10/13/15   Breeback, Jade L, PA-C  Naproxen-Esomeprazole 500-20 MG TBEC Take 1 tablet by mouth 2 (two) times daily as needed. 10/13/15   Breeback, Jade L, PA-C  traMADol (ULTRAM) 50 MG tablet Take 1 tablet (50 mg total) by mouth every 8 (eight) hours as needed. 08/02/16   Rodolph Bong, MD    Family History Family History  Problem Relation Age of Onset  . Cancer Father        bladder  . Diabetes Father     Social History Social History   Tobacco Use  . Smoking status: Current Every Day Smoker  . Smokeless tobacco: Current User    Types: Chew  Substance Use Topics  . Alcohol use: Yes    Alcohol/week: 2.0 standard drinks  Types: 2 Cans of beer per week    Comment: daily  . Drug use: No     Allergies   Patient has no known allergies.   Review of Systems Review of Systems  Constitutional: Negative for fatigue and fever.  Musculoskeletal: Positive for stiffness.  All other systems reviewed and are negative.    Physical Exam Triage Vital Signs ED Triage Vitals [05/09/18 1530]  Enc Vitals Group     BP 110/75     Pulse Rate (!) 101     Resp 16     Temp 99.5 F (37.5 C)     Temp Source Oral     SpO2 97 %     Weight 252 lb (114.3 kg)     Height      Head Circumference      Peak Flow      Pain Score 3     Pain Loc      Pain Edu?      Excl. in GC?    No data found.  Updated Vital Signs BP 110/75   Pulse (!) 101   Temp 99.5 F (37.5 C) (Oral)   Resp 16   Wt  114.3 kg   SpO2 97%   BMI 37.21 kg/m   Visual Acuity Right Eye Distance:   Left Eye Distance:   Bilateral Distance:    Right Eye Near:   Left Eye Near:    Bilateral Near:     Physical Exam  Constitutional: He appears well-developed and well-nourished. No distress.  HENT:  Head: Normocephalic.  Mouth/Throat: Oropharynx is clear and moist.  Eyes: Pupils are equal, round, and reactive to light. Conjunctivae are normal.  Cardiovascular: Normal heart sounds.  Pulmonary/Chest: Breath sounds normal.  Abdominal: There is no tenderness.  Musculoskeletal: He exhibits no edema.       Right knee: He exhibits decreased range of motion, swelling and erythema. He exhibits no effusion, no ecchymosis, no laceration and normal alignment. Tenderness found.       Legs: Right knee:  There is erythema, warmth, and tenderness over anterior knee as noted on diagram.   Knee stable, negative drawer test.  Patient has pain with flexion beyond 90 degrees.  Lymphadenopathy:    He has no cervical adenopathy.  Neurological: He is alert.  Skin: Skin is warm and dry.  Nursing note and vitals reviewed.    UC Treatments / Results  Labs (all labs ordered are listed, but only abnormal results are displayed) Labs Reviewed  POCT CBC W AUTO DIFF ([facility]):  WBC 17.8; LY 15.2; MO 10.6; GR 74/2; Hgb 15.4; Platelets 254     EKG None  Radiology No results found.  Procedures Procedures (including critical care time)  Medications Ordered in UC Medications  cefTRIAXone (ROCEPHIN) injection 1,000 mg (has no administration in time range)    Initial Impression / Assessment and Plan / UC Course  I have reviewed the triage vital signs and the nursing notes.  Pertinent labs & imaging results that were available during my care of the patient were reviewed by me and considered in my medical decision making (see chart for details).    Note significant leukocytosis (WBC 17.8) Administered Rocephin  1 gm IM.  Begin Clindamycin. Followup with Family Doctor if not improved in 2 days.   Final Clinical Impressions(s) / UC Diagnoses   Final diagnoses:  Cellulitis of right knee     Discharge Instructions     Begin clindamycin Wednesday  05/10/18. May take Ibuprofen 200mg , 4 tabs every 8 hours with food.   If symptoms become significantly worse during the night or over the weekend, proceed to the local emergency room.     ED Prescriptions    Medication Sig Dispense Auth. Provider   clindamycin (CLEOCIN) 300 MG capsule Take one cap by mouth every 8 hours 30 capsule Lattie Haw, MD         Lattie Haw, MD 05/09/18 [phone]

## 2018-05-09 NOTE — ED Triage Notes (Signed)
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 Saturday, right knee started to swell and became red and hot. PT reports a fever at home. Temp 99.5 in triage. PT had ibuprofen 2.5 hours ago.

## 2018-05-09 NOTE — Discharge Instructions (Addendum)
Begin clindamycin Wednesday 05/10/18. May take Ibuprofen 200mg , 4 tabs every 8 hours with food.   If symptoms become significantly worse during the night or over the weekend, proceed to the local emergency room.

## 2018-05-11 ENCOUNTER — Telehealth: Payer: Self-pay

## 2018-05-11 NOTE — Telephone Encounter (Signed)
Left voice message inquiring about patients status. Encouraged patient to call with questions or concerns.  

## 2018-05-12 ENCOUNTER — Encounter: Payer: Self-pay | Admitting: Family Medicine

## 2018-05-12 ENCOUNTER — Ambulatory Visit (INDEPENDENT_AMBULATORY_CARE_PROVIDER_SITE_OTHER): Payer: BLUE CROSS/BLUE SHIELD | Admitting: Family Medicine

## 2018-05-12 ENCOUNTER — Ambulatory Visit (INDEPENDENT_AMBULATORY_CARE_PROVIDER_SITE_OTHER): Payer: BLUE CROSS/BLUE SHIELD

## 2018-05-12 VITALS — BP 147/81 | HR 71 | Ht 70.0 in | Wt 261.0 lb

## 2018-05-12 DIAGNOSIS — M71161 Other infective bursitis, right knee: Secondary | ICD-10-CM

## 2018-05-12 DIAGNOSIS — M25561 Pain in right knee: Secondary | ICD-10-CM | POA: Diagnosis not present

## 2018-05-12 DIAGNOSIS — R600 Localized edema: Secondary | ICD-10-CM | POA: Diagnosis not present

## 2018-05-12 DIAGNOSIS — M7989 Other specified soft tissue disorders: Secondary | ICD-10-CM | POA: Diagnosis not present

## 2018-05-12 DIAGNOSIS — M25562 Pain in left knee: Secondary | ICD-10-CM | POA: Diagnosis not present

## 2018-05-12 MED ORDER — CEFDINIR 300 MG PO CAPS: 300.0000 mg | ORAL_CAPSULE | Freq: Two times a day (BID) | ORAL | 0 refills | Status: DC

## 2018-05-12 MED ORDER — DOXYCYCLINE HYCLATE 100 MG PO TABS: 100.0000 mg | ORAL_TABLET | Freq: Two times a day (BID) | ORAL | 0 refills | Status: DC

## 2018-05-12 NOTE — Patient Instructions (Signed)
Thank you for coming in today.  Take both doxycycline and omnicef STOP clindamycin.   Recheck midweek next week.   Return sooner if needed.    Skin Abscess A skin abscess is an infected area on or under your skin that contains a collection of pus and other material. An abscess may also be called a furuncle, carbuncle, or boil. An abscess can occur in or on almost any part of your body. Some abscesses break open (rupture) on their own. Most continue to get worse unless they are treated. The infection can spread deeper into the body and eventually into your blood, which can make you feel ill. Treatment usually involves draining the abscess. What are the causes? An abscess occurs when germs, often bacteria, pass through your skin and cause an infection. This may be caused by:  A scrape or cut on your skin.  A puncture wound through your skin, including a needle injection.  Blocked oil or sweat glands.  Blocked and infected hair follicles.  A cyst that forms beneath your skin (sebaceous cyst) and becomes infected.  What increases the risk? This condition is more likely to develop in people who:  Have a weak body defense system (immune system).  Have diabetes.  Have dry and irritated skin.  Get frequent injections or use illegal IV drugs.  Have a foreign body in a wound, such as a splinter.  Have problems with their lymph system or veins.  What are the signs or symptoms? An abscess may start as a painful, firm bump under the skin. Over time, the abscess may get larger or become softer. Pus may appear at the top of the abscess, causing pressure and pain. It may eventually break through the skin and drain. Other symptoms include:  Redness.  Warmth.  Swelling.  Tenderness.  A sore on the skin.  How is this diagnosed? This condition is diagnosed based on your medical history and a physical exam. A sample of pus may be taken from the abscess to find out what is causing  the infection and what antibiotics can be used to treat it. You also may have:  Blood tests to look for signs of infection or spread of an infection to your blood.  Imaging studies such as ultrasound, CT scan, or MRI if the abscess is deep.  How is this treated? Small abscesses that drain on their own may not need treatment. Treatment for an abscess that does not rupture on its own may include:  Warm compresses applied to the area several times per day.  Incision and drainage. Your health care provider will make an incision to open the abscess and will remove pus and any foreign body or dead tissue. The incision area may be packed with gauze to keep it open for a few days while it heals.  Antibiotic medicines to treat infection. For a severe abscess, you may first get antibiotics through an IV and then change to oral antibiotics.  Follow these instructions at home: Abscess Care  If you have an abscess that has not drained, place a warm, clean, wet washcloth over the abscess several times a day. Do this as told by your health care provider.  Follow instructions from your health care provider about how to take care of your abscess. Make sure you: ? Cover the abscess with a bandage (dressing). ? Change your dressing or gauze as told by your health care provider. ? Wash your hands with soap and water before you change  the dressing or gauze. If soap and water are not available, use hand sanitizer.  Check your abscess every day for signs of a worsening infection. Check for: ? More redness, swelling, or pain. ? More fluid or blood. ? Warmth. ? More pus or a bad smell. Medicines  Take over-the-counter and prescription medicines only as told by your health care provider.  If you were prescribed an antibiotic medicine, take it as told by your health care provider. Do not stop taking the antibiotic even if you start to feel better. General instructions  To avoid spreading the  infection: ? Do not share personal care items, towels, or hot tubs with others. ? Avoid making skin contact with other people.  Keep all follow-up visits as told by your health care provider. This is important. Contact a health care provider if:  You have more redness, swelling, or pain around your abscess.  You have more fluid or blood coming from your abscess.  Your abscess feels warm to the touch.  You have more pus or a bad smell coming from your abscess.  You have a fever.  You have muscle aches.  You have chills or a general ill feeling. Get help right away if:  You have severe pain.  You see red streaks on your skin spreading away from the abscess. This information is not intended to replace advice given to you by your health care provider. Make sure you discuss any questions you have with your health care provider. Document Released: 03/31/2005 Document Revised: 02/15/2016 Document Reviewed: 04/30/2015 Elsevier Interactive Patient Education  [employer].

## 2018-05-12 NOTE — Progress Notes (Signed)
Gregory Peters is a 47 y.o. male who presents to [facility] today for right knee septic prepatellar bursitis.  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  He developed knee pain and swelling on the across the anterior knee over the last week or so.  He was seen in urgent care on November 5 and thought to have cellulitis or prepatellar bursitis.  He was prescribed clindamycin and given a ceftriaxone injection IM prior to discharge.  He was advised to follow-up with me.  He notes he is not feeling any better and is in fact worse.  He notes extensive swelling redness and pain at the anterior right knee.  Pain is worse with activity and better with rest.  No significant fevers or chills.  No vomiting or diarrhea.  No discharge from the wound.   ROS as above:  Exam:  BP (!) 147/81   Pulse 71   Ht 5\' 10"  (1.778 m)   Wt 261 lb (118.4 kg)   BMI 37.45 kg/m  Wt Readings from Last 5 Encounters:  05/12/18 261 lb (118.4 kg)  05/09/18 252 lb (114.3 kg)  07/30/16 286 lb (129.7 kg)  10/13/15 273 lb (123.8 kg)  05/17/14 292 lb (132.5 kg)    Gen: Well NAD HEENT: EOMI,  MMM Lungs: Normal work of breathing. CTABL Heart: RRR no MRG Abd: NABS, Soft. Nondistended, Nontender Exts: Brisk capillary refill, warm and well perfused.  Right anterior knee significant erythema induration and fluctuance overlying the patella and patellar tendon.  No significant joint effusion present.  Decreased knee flexion and extension due to pain.  The erythema induration of fluctuance overlying the anterior knee is extremely tender to palpation.  Small amount of pus is expressible with minimal palpation.  Incision and drainage of Right Knee septic prepatellar bursitis: Consent obtained and timeout performed. Area of maximal fluctuance identified via  inspection and ultrasound as below. Skin cleaned with alcohol cold spray applied and 4 mL of lidocaine injected subcutaneously achieving blanching and some anesthesia.  I waited 10 minutes to allow full effect of lidocaine. Skin was then resterilized after waiting with chlorhexidine. Sharp incision was made and immediate pus started draining. Pus was cultured and blunt dissection was used to further widen the incision. This was quite painful for the patient.  He started experiencing lightheadedness and sweating.  He was positioned in a fully recumbent position and observed for 10 minutes.  Heart rate remained 70 to 90 bpm.  He felt better rapidly and the procedure was continued. The incision was widened and a small triangle of skin was removed to further allow more purulence to be drained.  Further blunt dissection was used to break up loculations and more pus was again expressed from the wound. Ointment and a dressing was applied and an Ace wrap was applied.  Following procedure patient felt significant pain relief and much better. Minimal blood loss.   Lab and Radiology Results X-ray right knee images personally independently reviewed No acute fracture.  Minimal degenerative changes.  Anterior soft tissue swelling present. Await formal radiology review.  Limited musculoskeletal ultrasound of the right anterior knee. Significant hypoechoic fluid accumulation in the prepatellar bursa space right knee.  No significant joint effusion present.  Normal-appearing patella and patellar tendon. Impression prepatellar bursitis    Assessment and Plan: 47 y.o. male with  Right knee pain and swelling due to septic  prepatellar bursitis.  Given the significant infection I did not think that aspiration was going to be sufficient.  The bursa was incised and drained and a large amount of caseous purulent material was expressed.  This was sent for culture.  It is possible that we will have a false negative  culture as patient was empirically treated with antibiotics prior to incision and drainage today.  The primary treatment is the incision and drainage however we will go ahead and treat with antibiotics as well.  We will switch away from clindamycin as it did not appear to be helpful.  Will prescribe both doxycycline and Omnicef to cover MRSA as well as anaerobes and strep species.   Recheck midweek next week (approximately 5 days).  Return sooner if needed.   Orders Placed This Encounter  Procedures  . Wound culture    Order Specific Question:   Source    Answer:   right knee prepat burs  . DG Knee Complete 4 Views Right    Standing Status:   Future    Number of Occurrences:   1    Standing Expiration Date:   07/13/2019    Order Specific Question:   Reason for Exam (SYMPTOM  OR DIAGNOSIS REQUIRED)    Answer:   acute knee pain    Order Specific Question:   Preferred imaging location?    Answer:   [facility]    Order Specific Question:   Radiology Contrast Protocol - do NOT remove file path    Answer:   [email or web address]  . DG Knee 1-2 Views Left    Standing Status:   Future    Number of Occurrences:   1    Standing Expiration Date:   07/13/2019    Order Specific Question:   Reason for Exam (SYMPTOM  OR DIAGNOSIS REQUIRED)    Answer:   For use with right knee x-ray, bilateral AP and Rosenberg standing.    Order Specific Question:   Preferred imaging location?    Answer:   [facility]   Meds ordered this encounter  Medications  . doxycycline (VIBRA-TABS) 100 MG tablet    Sig: Take 1 tablet (100 mg total) by mouth 2 (two) times daily.    Dispense:  14 tablet    Refill:  0  . cefdinir (OMNICEF) 300 MG capsule    Sig: Take 1 capsule (300 mg total) by mouth 2 (two) times daily.    Dispense:  14 capsule    Refill:  0     Historical information moved to improve visibility of documentation.  No past medical history on  file. Past Surgical History:  Procedure Laterality Date  . AMPUTATION Right 08/16/2013   Procedure: INDEX REVISION AMPUTATION DIGIT;  Surgeon: Tami Ribas, MD;  Location: [facility] OR;  Service: Orthopedics;  Laterality: Right;  . COLONOSCOPY    . FEMUR SURGERY Right    Age 54  . HERNIA REPAIR  2015  . INNER EAR SURGERY Left    Social History   Tobacco Use  . Smoking status: Current Every Day Smoker  . Smokeless tobacco: Current User    Types: Chew  Substance Use Topics  . Alcohol use: Yes    Alcohol/week: 2.0 standard drinks    Types: 2 Cans of beer per week    Comment: daily   family history includes Cancer in his father; Diabetes in his father.  Medications: Current Outpatient Medications  Medication Sig Dispense Refill  . baclofen (LIORESAL) 10  MG tablet Take 1 tablet (10 mg total) by mouth 3 (three) times daily. 30 each 0  . cyclobenzaprine (FLEXERIL) 10 MG tablet Take 1 tablet (10 mg total) by mouth 3 (three) times daily as needed for muscle spasms. 30 tablet 0  . ibuprofen (ADVIL,MOTRIN) 200 MG tablet Take 200 mg by mouth every 6 (six) hours as needed.    . Naproxen-Esomeprazole 500-20 MG TBEC Take 1 tablet by mouth 2 (two) times daily as needed. 60 tablet 5  . traMADol (ULTRAM) 50 MG tablet Take 1 tablet (50 mg total) by mouth every 8 (eight) hours as needed. 30 tablet 0  . cefdinir (OMNICEF) 300 MG capsule Take 1 capsule (300 mg total) by mouth 2 (two) times daily. 14 capsule 0  . doxycycline (VIBRA-TABS) 100 MG tablet Take 1 tablet (100 mg total) by mouth 2 (two) times daily. 14 tablet 0   No current facility-administered medications for this visit.    No Known Allergies   Discussed warning signs or symptoms. Please see discharge instructions. Patient expresses understanding.

## 2018-05-15 LAB — WOUND CULTURE
MICRO NUMBER:: 91348810
SPECIMEN QUALITY:: ADEQUATE

## 2018-05-16 ENCOUNTER — Ambulatory Visit (INDEPENDENT_AMBULATORY_CARE_PROVIDER_SITE_OTHER): Payer: BLUE CROSS/BLUE SHIELD | Admitting: Physician Assistant

## 2018-05-16 ENCOUNTER — Encounter: Payer: Self-pay | Admitting: Physician Assistant

## 2018-05-16 VITALS — BP 125/67 | HR 70 | Ht 70.0 in | Wt 267.0 lb

## 2018-05-16 DIAGNOSIS — M71161 Other infective bursitis, right knee: Secondary | ICD-10-CM

## 2018-05-16 NOTE — Progress Notes (Signed)
   Subjective:    Patient ID: Gregory [facility], male    DOB: Aug 02, 1970, 46 y.o.   MRN: [id number]  HPI Patient is a 47 year old male with a recent history of right knee cellulitis that developed into septic prepatellar bursitis.  It was confirmed with wound culture that knee was infected with staph aureus.  He is currently on 2 antibiotics Omnicef and doxycycline.  He still has about half the prescription left.  He reports he has significantly better.  The pain is almost all resolved.  He still has a residual ulcer where fluid was drawn off.  He denies any fever, chills, nausea, body aches.  He has been out of work for the last week he feels like he is ready to go back.  He does have a very physical job and he is frequently on his knees.  There is some minimal drainage that occurs from the knee.  He continues to keep covered and wrapped in an Ace wrap.  .. Active Ambulatory Problems    Diagnosis Date Noted  . Crushing injury of right index finger 08/16/2013  . Tobacco dependence 05/20/2014  . Obese 05/20/2014  . Other fatigue 05/20/2014  . Right-sided low back pain without sciatica 10/15/2015  . Elbow injury, right, initial encounter 07/30/2016  . Rupture of distal biceps tendon 08/02/2016   Resolved Ambulatory Problems    Diagnosis Date Noted  . No Resolved Ambulatory Problems   No Additional Past Medical History      Review of Systems  All other systems reviewed and are negative.      Objective:   Physical Exam  Constitutional: He is oriented to person, place, and time. He appears well-developed and well-nourished.  HENT:  Head: Normocephalic and atraumatic.  Cardiovascular: Normal rate and regular rhythm.  Musculoskeletal:  NROM of right knee.   Neurological: He is alert and oriented to person, place, and time.  Skin: No rash noted.  right knee:  Some minimal erythema and tenderness  With small effusion and one ulceration about 1cm by 1cm over lateral knee joint space that  is draining minimally. Some scaling of skin around knee.    Psychiatric: He has a normal mood and affect. His behavior is normal.          Assessment & Plan:  [name] KitchenMarland KitchenDiagnoses and all orders for this visit:  Septic prepatellar bursitis of right knee  pt is improving greatly. Strongly encouraged him to finish both antibiotics. Encouraged to keep elevated when can. Could consider warm compresses to increase blood supply to area. Written him back to work, full duty, no restrictions. I did encourage him to avoid putting and weight and pressure on knees. Keep clean with soap and water. Dry well before bandaging up and for the next 3 days putting bactroban on ulceration. Keep compression until done draining. Follow up as needed after finishing abx.

## 2018-06-23 ENCOUNTER — Encounter: Payer: BLUE CROSS/BLUE SHIELD | Admitting: Physician Assistant

## 2018-10-17 ENCOUNTER — Other Ambulatory Visit: Payer: Self-pay

## 2018-10-17 ENCOUNTER — Ambulatory Visit (INDEPENDENT_AMBULATORY_CARE_PROVIDER_SITE_OTHER): Payer: BLUE CROSS/BLUE SHIELD | Admitting: Physician Assistant

## 2018-10-17 ENCOUNTER — Encounter: Payer: Self-pay | Admitting: Physician Assistant

## 2018-10-17 VITALS — Temp 99.6°F | Ht 70.0 in | Wt 267.0 lb

## 2018-10-17 DIAGNOSIS — R197 Diarrhea, unspecified: Secondary | ICD-10-CM

## 2018-10-17 DIAGNOSIS — R52 Pain, unspecified: Secondary | ICD-10-CM

## 2018-10-17 DIAGNOSIS — J3489 Other specified disorders of nose and nasal sinuses: Secondary | ICD-10-CM | POA: Diagnosis not present

## 2018-10-17 NOTE — Progress Notes (Signed)
Patient ID: Gregory Peters, male   DOB: 1970-08-08, 48 y.o.   MRN: [id number] .Virtual Visit via Telephone Note  I connected with Gregory Peters on 10/18/18 at  8:30 AM EDT by telephone and verified that I am speaking with the correct person using two identifiers.   I discussed the limitations, risks, security and privacy concerns of performing an evaluation and management service by telephone and the availability of in person appointments. I also discussed with the patient that there may be a patient responsible charge related to this service. The patient expressed understanding and agreed to proceed.   History of Present Illness: Pt is a 48 yo male who calls into clinic with runny nose for a few days and last night start low grade temperature, loose stools, nausea, body aches and fatigue. He has not really taken anything for symptoms. No other sick contacts. No cough, SOB. He continues to work during the [name] pandemic. He denies eating any new foods or foods that are raw.   .. Active Ambulatory Problems    Diagnosis Date Noted  . Crushing injury of right index finger 08/16/2013  . Tobacco dependence 05/20/2014  . Obese 05/20/2014  . Other fatigue 05/20/2014  . Right-sided low back pain without sciatica 10/15/2015  . Elbow injury, right, initial encounter 07/30/2016  . Rupture of distal biceps tendon 08/02/2016   Resolved Ambulatory Problems    Diagnosis Date Noted  . No Resolved Ambulatory Problems   No Additional Past Medical History   Reviewed med, allergy, problem list.    Observations/Objective: No acute distress  .[name] Kitchen Today's Vitals   10/17/18 0828  Temp: 99.6 F (37.6 C)  TempSrc: Oral  Weight: 267 lb (121.1 kg)  Height: 5\' 10"  (1.778 m)   Body mass index is 38.31 kg/m.   Assessment and Plan: [name] KitchenMarland KitchenFrank was seen today for fatigue.  Diagnoses and all orders for this visit:  Diarrhea, unspecified type  Rhinorrhea  Generalized body aches   Discussed with patient  symptoms likely are viral. Some persons have reported GI symptoms with COVID. I would like to monitor in self quarantine for 3 days to see how symptoms develop. If no fever, and GI symptoms improve then can go back to work. If develops more covid symptoms will need to quarantine for 2 weeks. Avoid ibuprofen products. Use tylenol products if needed. Keep to a BRAT diet for diarrhea. Keep hydrated. Follow up as needed or if symptoms worsen.   Letter for suspected COVID written for patient to give to work.     Follow Up Instructions:    I discussed the assessment and treatment plan with the patient. The patient was provided an opportunity to ask questions and all were answered. The patient agreed with the plan and demonstrated an understanding of the instructions.   The patient was advised to call back or seek an in-person evaluation if the symptoms worsen or if the condition fails to improve as anticipated.  I provided  12 minutes of non-face-to-face time during this encounter.   Tandy Gaw, PA-C

## 2018-10-17 NOTE — Progress Notes (Deleted)
-  runny nose for several days -body aches last night, 99.6 temp  -extreme diarrhea, unsettled stomach -fatigued

## 2018-10-18 ENCOUNTER — Telehealth: Payer: Self-pay | Admitting: Physician Assistant

## 2018-10-18 ENCOUNTER — Encounter: Payer: Self-pay | Admitting: Physician Assistant

## 2018-10-18 NOTE — Telephone Encounter (Signed)
Called for status check, no and and no VM picked up

## 2018-10-19 NOTE — Telephone Encounter (Signed)
No answer but was able to leave msg. Asked pt to call back with status update. Direct call back info provided.

## 2018-10-23 ENCOUNTER — Telehealth: Payer: Self-pay | Admitting: Physician Assistant

## 2018-10-23 NOTE — Telephone Encounter (Signed)
Ok for work note to go back on Wednesday that would be 7 days since his mychart visit.

## 2018-10-23 NOTE — Telephone Encounter (Signed)
See other phone note

## 2018-10-23 NOTE — Telephone Encounter (Signed)
LMOM Letting patient know note completed and at front desk for pick up.

## 2018-10-23 NOTE — Telephone Encounter (Signed)
Patient states that this whole weekend he has been feeling better. No fever or any other reported symptoms. Would like to know what he needs to do to get a letter to release him back to work by Wednesday. Please advise.

## 2019-04-30 ENCOUNTER — Other Ambulatory Visit: Payer: Self-pay

## 2019-04-30 ENCOUNTER — Ambulatory Visit (INDEPENDENT_AMBULATORY_CARE_PROVIDER_SITE_OTHER): Payer: BC Managed Care – PPO

## 2019-04-30 ENCOUNTER — Encounter: Payer: Self-pay | Admitting: Physician Assistant

## 2019-04-30 ENCOUNTER — Ambulatory Visit (INDEPENDENT_AMBULATORY_CARE_PROVIDER_SITE_OTHER): Payer: BC Managed Care – PPO | Admitting: Physician Assistant

## 2019-04-30 VITALS — BP 115/64 | HR 84 | Ht 69.0 in | Wt 269.0 lb

## 2019-04-30 DIAGNOSIS — M545 Low back pain: Secondary | ICD-10-CM | POA: Diagnosis not present

## 2019-04-30 DIAGNOSIS — M79604 Pain in right leg: Secondary | ICD-10-CM

## 2019-04-30 DIAGNOSIS — M25551 Pain in right hip: Secondary | ICD-10-CM

## 2019-04-30 DIAGNOSIS — R1031 Right lower quadrant pain: Secondary | ICD-10-CM

## 2019-04-30 DIAGNOSIS — Z23 Encounter for immunization: Secondary | ICD-10-CM

## 2019-04-30 DIAGNOSIS — M5136 Other intervertebral disc degeneration, lumbar region: Secondary | ICD-10-CM

## 2019-04-30 DIAGNOSIS — Z13220 Encounter for screening for lipoid disorders: Secondary | ICD-10-CM

## 2019-04-30 DIAGNOSIS — Z131 Encounter for screening for diabetes mellitus: Secondary | ICD-10-CM

## 2019-04-30 DIAGNOSIS — M160 Bilateral primary osteoarthritis of hip: Secondary | ICD-10-CM | POA: Diagnosis not present

## 2019-04-30 DIAGNOSIS — M1611 Unilateral primary osteoarthritis, right hip: Secondary | ICD-10-CM

## 2019-04-30 MED ORDER — MELOXICAM 15 MG PO TABS: 15.0000 mg | ORAL_TABLET | Freq: Every day | ORAL | 1 refills | Status: DC

## 2019-04-30 NOTE — Progress Notes (Signed)
Subjective:    Patient ID: Gregory [facility], male    DOB: Jul 03, 1971, 48 y.o.   MRN: [id number]  HPI  Pt is a 48 yo male who presents to the clinic to discuss right hip/groin/leg pain. Pain ongoing intermittently for last year. In the last month pain is constant and worsening. Pain seems to start in right lateral right hip and right groin and radiate down side of leg into calf. Pt denies any injury. Ibuprofen does help constant dull ache but not the sharp pains. Pt made worse with stairs, putting shoes on and any movement of left hip to the left or to the right.   .. Active Ambulatory Problems    Diagnosis Date Noted  . Crushing injury of right index finger 08/16/2013  . Tobacco dependence 05/20/2014  . Obese 05/20/2014  . Other fatigue 05/20/2014  . Right-sided low back pain without sciatica 10/15/2015  . Elbow injury, right, initial encounter 07/30/2016  . Rupture of distal biceps tendon 08/02/2016   Resolved Ambulatory Problems    Diagnosis Date Noted  . No Resolved Ambulatory Problems   No Additional Past Medical History       Review of Systems    see HPI.  Objective:   Physical Exam Vitals signs reviewed.  Constitutional:      Appearance: Normal appearance.  HENT:     Head: Normocephalic.  Cardiovascular:     Rate and Rhythm: Normal rate and regular rhythm.     Pulses: Normal pulses.  Pulmonary:     Effort: Pulmonary effort is normal.     Breath sounds: Normal breath sounds.  Musculoskeletal:     Comments: He has a natural position of external rotation of right hip.  Right hip: Tenderness to palpation in right groin over hip flexor. Pain with internal and external rotation.  Negative straight leg.  Strength 5/5.  No tenderness over the right greater trochanter.   Neurological:     General: No focal deficit present.     Mental Status: He is alert and oriented to person, place, and time.  Psychiatric:        Mood and Affect: Mood normal.            Assessment & Plan:  [name] KitchenMarland KitchenLeny was seen today for hip pain.  Diagnoses and all orders for this visit:  Right groin pain -     DG Lumbar Spine Complete -     DG HIPS BILAT WITH PELVIS 3-4 VIEWS -     meloxicam (MOBIC) 15 MG tablet; Take 1 tablet (15 mg total) by mouth daily.  Flu vaccine need -     Flu Vaccine QUAD 36+ mos IM  Need for Tdap vaccination -     Tdap vaccine greater than or equal to 7yo IM  Right hip pain -     DG Lumbar Spine Complete -     DG HIPS BILAT WITH PELVIS 3-4 VIEWS -     meloxicam (MOBIC) 15 MG tablet; Take 1 tablet (15 mg total) by mouth daily.  Right leg pain -     DG Lumbar Spine Complete -     DG HIPS BILAT WITH PELVIS 3-4 VIEWS -     meloxicam (MOBIC) 15 MG tablet; Take 1 tablet (15 mg total) by mouth daily.  Screening for lipid disorders -     Lipid Panel w/reflex Direct LDL  Screening for diabetes mellitus -     COMPLETE METABOLIC PANEL WITH GFR   Will get  hip/pelvis/low back xray. Seems more like OA of hip. With natural external rotation concerned about some dislocation.   Stop ibuprofen. Start mobic.  Will get treatment plan after xrays. Due to severity of pain likely need formal PT.   Pt does not come in very often. Ordered labs. Discussed importance of health prevention.

## 2019-05-01 ENCOUNTER — Ambulatory Visit (INDEPENDENT_AMBULATORY_CARE_PROVIDER_SITE_OTHER): Payer: BC Managed Care – PPO | Admitting: Sports Medicine

## 2019-05-01 ENCOUNTER — Encounter: Payer: Self-pay | Admitting: Sports Medicine

## 2019-05-01 DIAGNOSIS — M1611 Unilateral primary osteoarthritis, right hip: Secondary | ICD-10-CM | POA: Insufficient documentation

## 2019-05-01 DIAGNOSIS — M4317 Spondylolisthesis, lumbosacral region: Secondary | ICD-10-CM

## 2019-05-01 MED ORDER — GABAPENTIN 300 MG PO CAPS: ORAL_CAPSULE | ORAL | 3 refills | Status: DC

## 2019-05-01 NOTE — Addendum Note (Signed)
Addended by: Donella Stade on: 05/01/2019 08:08 AM   Modules accepted: Orders

## 2019-05-01 NOTE — Assessment & Plan Note (Signed)
L5-S1 spondylolisthesis with likely right-sided L5 radicular symptoms, adding formal PT, gabapentin at bedtime, continue meloxicam. Return in 6 weeks.

## 2019-05-01 NOTE — Progress Notes (Signed)
Subjective:    CC: R Hip pain  HPI: Gregory Peters is a 48 year old presenting today for ~1 week of severe R sided hip pain. He experiences a popping sensation and is unable to bear weight on that side for ~1 minute while the pain subsides. Pain is worst when he internally rotates the hip to put his shoe on. His pain has spread down his R leg to the mid calf described as sharp and shooting. The pain and lack of functionality is interfering with his ability to work. He has had no loss of bowl or bladder function.   I reviewed the past medical history, family history, social history, surgical history, and allergies today and no changes were needed.  Please see the problem list section below in epic for further details.  Past Medical History: No past medical history on file. Past Surgical History: Past Surgical History:  Procedure Laterality Date  . AMPUTATION Right 08/16/2013   Procedure: INDEX REVISION AMPUTATION DIGIT;  Surgeon: Tennis Must, MD;  Location: [facility];  Service: Orthopedics;  Laterality: Right;  . COLONOSCOPY    . FEMUR SURGERY Right    Age 62  . HERNIA REPAIR  2015  . INNER EAR SURGERY Left    Social History: Social History   Socioeconomic History  . Marital status: Married    Spouse name: Not on file  . Number of children: Not on file  . Years of education: Not on file  . Highest education level: Not on file  Occupational History  . Not on file  Social Needs  . Financial resource strain: Not on file  . Food insecurity    Worry: Not on file    Inability: Not on file  . Transportation needs    Medical: Not on file    Non-medical: Not on file  Tobacco Use  . Smoking status: Current Every Day Smoker  . Smokeless tobacco: Current User    Types: Chew  Substance and Sexual Activity  . Alcohol use: Yes    Alcohol/week: 2.0 standard drinks    Types: 2 Cans of beer per week    Comment: daily  . Drug use: No  . Sexual activity: Yes  Lifestyle  . Physical activity   Days per week: Not on file    Minutes per session: Not on file  . Stress: Not on file  Relationships  . Social Herbalist on phone: Not on file    Gets together: Not on file    Attends religious service: Not on file    Active member of club or organization: Not on file    Attends meetings of clubs or organizations: Not on file    Relationship status: Not on file  Other Topics Concern  . Not on file  Social History Narrative  . Not on file   Family History: Family History  Problem Relation Age of Onset  . Cancer Father        bladder  . Diabetes Father    Allergies: No Known Allergies Medications: See med rec.  Review of Systems: No fevers, chills, night sweats, weight loss, chest pain, or shortness of breath.   Objective:    General: Well Developed, well nourished, and in no acute distress.  Neuro: Alert and oriented x3, extra-ocular muscles intact, sensation grossly intact.  HEENT: Normocephalic, atraumatic, pupils equal round reactive to light.  Skin: Warm and dry, no rashes. Cardiac: Regular rate and rhythm, no lower extremity edema.  Respiratory: Not using accessory muscles, speaking in full sentences. MSK: Weak extension of the knee. Sensation intact bilaterally.  Hip: ROM IR: 45 Deg, ER: 45 Deg, Flexion: 120 Deg, Extension: 100 Deg, Abduction: 45 Deg, Adduction: 45 Deg Pelvic alignment unremarkable to inspection and palpation. Unsteady gait favoring L side without trendelenburg sign. Greater trochanter without tenderness to palpation. No tenderness over piriformis. Tender over anterior hip joint. SI joint tenderness. Tenderness with internal/ external rotation.  A/P: Gregory Peters has sever R sided hip pain which is provoked by internal/ external rotation and palpation of the anterior hip joint. There is radiation down the front of the thigh to the mid calf. He is unable to bear weight when pain is provoked. 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 His OA was discussed and the decision to consult an orthopedic surgeon was reached. The OA is the primary driver of his pain at this time. DDD will be managed with gabapentin and lumbar PT in the meantime.  Impression and Recommendations:    Spondylolisthesis at L5-S1 level L5-S1 spondylolisthesis with likely right-sided L5 radicular symptoms, adding formal PT, gabapentin at bedtime, continue meloxicam. Return in 6 weeks.  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 Referral to Dr. Luiz Blare.   ___________________________________________ Gregory Peters. [facility], M.D., ABFM., CAQSM. Primary Care and Sports Medicine [facility]  Adjunct Professor of Family Medicine  [employer]

## 2019-05-01 NOTE — Progress Notes (Signed)
See lumbar spine xray notes.

## 2019-05-01 NOTE — Progress Notes (Signed)
For now continue to work. Moving overall is encouraged. Hopefully mobic will start to help.

## 2019-05-01 NOTE — Progress Notes (Signed)
Gregory Chime do have some lumbar degenerative disc disease of low back with disc space narrowing and some slippage. Your hip xray did show arthritis of hip and loss of joint space. I still don't think your symptoms today are from your back. I think they are coming from your hip. I want you to see our sports medicine provider Dr. Darene Lamer to evaluate. Schedule with him. mobic given can help and formal PT can help. I will order that as well.

## 2019-05-01 NOTE — Assessment & Plan Note (Signed)
End-stage right hip OA, bone-on-bone, avoiding any injections as I do think he needs a hip replacement somewhat urgently. Referral to Dr. Berenice Primas.

## 2019-05-07 ENCOUNTER — Ambulatory Visit (INDEPENDENT_AMBULATORY_CARE_PROVIDER_SITE_OTHER): Payer: BC Managed Care – PPO | Admitting: Physical Therapy

## 2019-05-07 ENCOUNTER — Encounter: Payer: Self-pay | Admitting: Physical Therapy

## 2019-05-07 ENCOUNTER — Other Ambulatory Visit: Payer: Self-pay

## 2019-05-07 DIAGNOSIS — M545 Low back pain: Secondary | ICD-10-CM

## 2019-05-07 DIAGNOSIS — M25551 Pain in right hip: Secondary | ICD-10-CM | POA: Diagnosis not present

## 2019-05-07 DIAGNOSIS — G8929 Other chronic pain: Secondary | ICD-10-CM

## 2019-05-07 DIAGNOSIS — M25651 Stiffness of right hip, not elsewhere classified: Secondary | ICD-10-CM | POA: Diagnosis not present

## 2019-05-07 NOTE — Patient Instructions (Signed)
Side Stretch    Lie on your back with your shoulders to the left and hips to the right. Move your straight left leg to the left and then try to bring the right leg to meet it. Stop when you feel a stretch in the right low back. Hold 1-5 min. 1-2 x/day.  You DO NOT have to raise arm overhead as shown.    Lower Trunk Rotation Stretch   Keeping back flat and feet together, rotate knees to left side. Hold __10__ seconds. Repeat __5__ times per set. Do ____ sets per session. Do _2_ sessions per day.  Go to left side only to start to avoid hip pain.  http://orth.exer.us/122   Copyright  VHI. All right  Trigger Point Dry Needling  . What is Trigger Point Dry Needling (DN)? o DN is a physical therapy technique used to treat muscle pain and dysfunction. Specifically, DN helps deactivate muscle trigger points (muscle knots).  o A thin filiform needle is used to penetrate the skin and stimulate the underlying trigger point. The goal is for a local twitch response (LTR) to occur and for the trigger point to relax. No medication of any kind is injected during the procedure.   . What Does Trigger Point Dry Needling Feel Like?  o The procedure feels different for each individual patient. Some patients report that they do not actually feel the needle enter the skin and overall the process is not painful. Very mild bleeding may occur. However, many patients feel a deep cramping in the muscle in which the needle was inserted. This is the local twitch response.   [name] Kitchen How Will I feel after the treatment? o Soreness is normal, and the onset of soreness may not occur for a few hours. Typically this soreness does not last longer than two days.  o Bruising is uncommon, however; ice can be used to decrease any possible bruising.  o In rare cases feeling tired or nauseous after the treatment is normal. In addition, your symptoms may get worse before they get better, this period will typically not last longer  than 24 hours.   . What Can I do After My Treatment? o Increase your hydration by drinking more water for the next 24 hours. o You may place ice or heat on the areas treated that have become sore, however, do not use heat on inflamed or bruised areas. Heat often brings more relief post needling. o You can continue your regular activities, but vigorous activity is not recommended initially after the treatment for 24 hours. o DN is best combined with other physical therapy such as strengthening, stretching, and other therapies.     Madelyn Flavors, PT 05/07/19 9:33 AM;  [facility] [place] [address] [place], [place] [address]  [phone] (office) [phone] (fax)

## 2019-05-07 NOTE — Therapy (Signed)
[facility] [place], [place], [address] Phone: [phone]   Fax:  [phone]  Physical Therapy Evaluation  Patient Details  Name: Gregory Peters MRN: [id number] Date of Birth: [birth year] Referring Provider (PT): Dr. Dianah Field   Encounter Date: 05/07/2019  PT End of Session - 05/07/19 0847    Visit Number  1    Date for PT Re-Evaluation  06/18/19    PT Start Time  0848    PT Stop Time  0934    PT Time Calculation (min)  46 min    Activity Tolerance  Patient tolerated treatment well    Behavior During Therapy  [facility] for tasks assessed/performed       History reviewed. No pertinent past medical history.  Past Surgical History:  Procedure Laterality Date  . AMPUTATION Right 08/16/2013   Procedure: INDEX REVISION AMPUTATION DIGIT;  Surgeon: Tennis Must, MD;  Location: [place];  Service: Orthopedics;  Laterality: Right;  . COLONOSCOPY    . FEMUR SURGERY Right    Age 48  . HERNIA REPAIR  2015  . INNER EAR SURGERY Left     There were no vitals filed for this visit.   Subjective Assessment - 05/07/19 0858    Subjective  Patient has had lower back pain for years on/off. He began feeling right hip pain starting 2 months ago. Pain is in anterior hip and thigh. He would experience popping sensation and then leg would want to buckle. The meds have decreased the constant ache but the popping is still there. The pain is not as extreme since starting meds either.    Pertinent History  arthritis right hip needs replacement; finger amputation; right ear canal reconstruction    How long can you sit comfortably?  20-30 min    How long can you stand comfortably?  takes weight off right side    How long can you walk comfortably?  inconsistent depending on popping    Diagnostic tests  xrays;    Patient Stated Goals  be able to put sock and shoe on right foot again, decrease pain, be able to perform his job    Currently in  Pain?  Yes    Pain Score  7     Pain Location  Hip    Pain Orientation  Right    Pain Descriptors / Indicators  Sharp    Pain Type  Acute pain    Pain Radiating Towards  ant hip down thigh    Pain Onset  More than a month ago    Pain Frequency  Constant    Aggravating Factors   walking    Pain Relieving Factors  meds    Effect of Pain on Daily Activities  limited with job requirements; hiking, walking         [facility] PT Assessment - 05/07/19 0001      Assessment   Medical Diagnosis  L5/S1 spondylolistheisi    Referring Provider (PT)  Dr. Dianah Field    Onset Date/Surgical Date  03/06/19    Hand Dominance  Right    Prior Therapy  no      Precautions   Precautions  None      Restrictions   Weight Bearing Restrictions  No      Balance Screen   Has the patient fallen in the past 6 months  No    Has the patient had a decrease in activity level because of a fear of falling?  No    Is the patient reluctant to leave their home because of a fear of falling?   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; very difficult to perform duties    Leisure  hiking;walkng      Posture/Postural Control   Posture/Postural Control  No significant limitations      ROM / Strength   AROM / PROM / Strength  AROM;Strength      AROM   AROM Assessment Site  Lumbar;Hip    Right/Left Hip  Right    Right Hip Extension  15    Right Hip Flexion  90   limited by pain; passively also   Right Hip External Rotation   23    Right Hip Internal Rotation   25    Right Hip ABduction  15    Right Hip ADduction  20    Lumbar Extension  15   pain   Lumbar - Right Side Bend  15   pain   Lumbar - Left Side Bend  22    Lumbar - Right Rotation  full    Lumbar - Left Rotation  full      Strength   Overall Strength Comments  Grossly  5/5 BLE, except right hip ABD 4-/5 with pain, left ABD 4+/5, right hip ext 3+/5, left hip ext 4+/5      Flexibility   Soft Tissue Assessment /Muscle Length  yes    Hamstrings  bil tightness    Piriformis  unable to assess tightness due to pain      Palpation   Palpation comment  tender in right TFL, ITB, gluteals, pirifomris                Objective measurements completed on examination: See above findings.      [facility] Adult PT Treatment/Exercise - 05/07/19 0001      Manual Therapy   Manual Therapy  Joint mobilization    Joint Mobilization  long leg traction 2x 30 sec; Rt hip distraction 3 x 20 sec             PT Education - 05/07/19 0933    Education Details  HEP; DN education    Person(s) Educated  Patient    Methods  Explanation;Demonstration;Handout    Comprehension  Verbalized understanding;Returned demonstration          PT Long Term Goals - 05/07/19 1720      PT LONG TERM GOAL #1   Title  Pt to have decreased pain in right hip by 50% or more with ADLS.    Time  6    Period  Weeks    Status  New    Target Date  06/18/19      PT LONG TERM GOAL #2   Title  Patient to improve right hip ROM to allow to don/doff sock and shoe.    Time  6    Period  Weeks    Status  New      PT LONG TERM GOAL #3   Title  Pt to demo improved BLE hip ABD and ext strength to 5/5 to improve function    Time  6    Period  Weeks    Status  New  PT LONG TERM GOAL #4   Title  Improved right hip flex to allow pt to squat to perform work functions.    Time  6    Period  Weeks    Status  New      PT LONG TERM GOAL #5   Title  Pt ind in strengthening and flexibility program for core and LEs.    Time  6    Period  Weeks    Status  New             Plan - 05/07/19 1712    Clinical Impression Statement  Patient presents with c/o right hip pain and intermittent LBP x 2 months. He has had intermittent LBP for years and xrays show L5/S1 anterolisthesis. He  also has significant OA in right hip and may need THR. He has limited R Hip ROM and marked tightness and tenderness around the joint limiting ADLS including his work functions as an Geneticist, molecularHVAC installer and putting on shoes/socks. He has LE strength deficits as well. He will benefit from PT to address these deficits.    Personal Factors and Comorbidities  Fitness;Comorbidity 3+    Comorbidities  arthritis, LBP, right hip OA needs replacement; finger amputation; right ear canal reconstruction    Examination-Activity Limitations  Bend;Dressing;Locomotion Level;Squat;Stairs;Stand    Stability/Clinical Decision Making  Evolving/Moderate complexity    Clinical Decision Making  Low    Rehab Potential  Fair    PT Frequency  2x / week    PT Duration  6 weeks    PT Treatment/Interventions  ADLs/Self Care Home Management;Cryotherapy;Electrical Stimulation;Moist Heat;Ultrasound;Therapeutic exercise;Neuromuscular re-education;Patient/family education;Manual techniques;Dry needling;Taping    PT Next Visit Plan  review HEP, continue with hip distraction/traction; STW/manual/DN to right hip muscles; lumbar stab/core (flexion biased)    PT Home Exercise Plan  supine C stretch; LTR    Consulted and Agree with Plan of Care  Patient       Patient will benefit from skilled therapeutic intervention in order to improve the following deficits and impairments:  Decreased range of motion, Pain, Increased muscle spasms, Decreased activity tolerance, Impaired flexibility, Decreased strength  Visit Diagnosis: Chronic midline low back pain, unspecified whether sciatica present - Plan: PT plan of care cert/re-cert  Pain in right hip - Plan: PT plan of care cert/re-cert  Stiffness of right hip, not elsewhere classified - Plan: PT plan of care cert/re-cert     Problem List Patient Active Problem List   Diagnosis Date Noted  . Primary osteoarthritis of right hip 05/01/2019  . Spondylolisthesis at L5-S1 level 05/01/2019  .  Rupture of distal biceps tendon 08/02/2016  . Elbow injury, right, initial encounter 07/30/2016  . Right-sided low back pain without sciatica 10/15/2015  . Tobacco dependence 05/20/2014  . Obese 05/20/2014  . Other fatigue 05/20/2014  . Crushing injury of right index finger 08/16/2013    Solon PalmJulie Sherwin Hollingshed PT 05/07/2019, 5:27 PM  [facility] [address] [place], [place], [address] Phone: [phone]   Fax:  [phone]  Name: Gregory FishMatthew Peters MRN: [id number] Date of Birth: 06/28/1971

## 2019-05-09 ENCOUNTER — Ambulatory Visit (INDEPENDENT_AMBULATORY_CARE_PROVIDER_SITE_OTHER): Payer: BC Managed Care – PPO | Admitting: Physical Therapy

## 2019-05-09 ENCOUNTER — Other Ambulatory Visit: Payer: Self-pay

## 2019-05-09 ENCOUNTER — Encounter: Payer: Self-pay | Admitting: Physical Therapy

## 2019-05-09 DIAGNOSIS — M545 Low back pain, unspecified: Secondary | ICD-10-CM

## 2019-05-09 DIAGNOSIS — G8929 Other chronic pain: Secondary | ICD-10-CM

## 2019-05-09 DIAGNOSIS — M25551 Pain in right hip: Secondary | ICD-10-CM | POA: Diagnosis not present

## 2019-05-09 DIAGNOSIS — M25651 Stiffness of right hip, not elsewhere classified: Secondary | ICD-10-CM

## 2019-05-09 NOTE — Therapy (Signed)
[place], [place], [address] Phone: [phone]   Fax:  [phone]  Physical Therapy Treatment  Patient Details  Name: Gregory Peters MRN: [id number] Date of Birth: [birth year] Referring Provider (PT): Dr. Dianah Field   Encounter Date: 05/09/2019  PT End of Session - 05/09/19 1430    Visit Number  2    Date for PT Re-Evaluation  06/18/19    PT Start Time  1430    PT Stop Time  1522    PT Time Calculation (min)  52 min    Activity Tolerance  Patient tolerated treatment well;Patient limited by pain    Behavior During Therapy  [facility] for tasks assessed/performed       History reviewed. No pertinent past medical history.  Past Surgical History:  Procedure Laterality Date  . AMPUTATION Right 08/16/2013   Procedure: INDEX REVISION AMPUTATION DIGIT;  Surgeon: Tennis Must, MD;  Location: [place];  Service: Orthopedics;  Laterality: Right;  . COLONOSCOPY    . FEMUR SURGERY Right    Age 48  . HERNIA REPAIR  2015  . INNER EAR SURGERY Left     There were no vitals filed for this visit.  Subjective Assessment - 05/09/19 1430    Subjective  Patient hurting today. Yesterday was a lot better.    Pertinent History  arthritis right hip needs replacement; finger amputation; right ear canal reconstruction    How long can you sit comfortably?  20-30 min    How long can you stand comfortably?  takes weight off right side    How long can you walk comfortably?  inconsistent depending on popping    Diagnostic tests  xrays;    Patient Stated Goals  be able to put sock and shoe on right foot again, decrease pain, be able to perform his job    Currently in Pain?  Yes    Pain Score  4     Pain Location  Hip    Pain Orientation  Right    Pain Descriptors / Indicators  Sharp    Pain Type  Acute pain    Pain Radiating Towards  into groin                       [facility] Adult PT Treatment/Exercise -  05/09/19 0001      Exercises   Exercises  Knee/Hip      Knee/Hip Exercises: Stretches   Hip Flexor Stretch  Right;2 reps;20 seconds    Hip Flexor Stretch Limitations  in chair; also did standing    Other Knee/Hip Stretches  Right butterfly 2x30 sec; supine C stretch caused pain in groin today so held from HEP    Other Knee/Hip Stretches  prone on elbows x 2 min for bil hip flexor stretch      Knee/Hip Exercises: Aerobic   Nustep  L3 x 5 min      Modalities   Modalities  Electrical Stimulation;Moist Heat      Moist Heat Therapy   Number Minutes Moist Heat  10 Minutes    Moist Heat Location  Hip   right     Electrical Stimulation   Electrical Stimulation Location  right gluteals/ant hip    Electrical Stimulation Action  IFC    Electrical Stimulation Parameters  10 min    Electrical Stimulation Goals  Pain      Manual Therapy   Manual Therapy  Joint mobilization;Soft tissue  mobilization;Myofascial release    Joint Mobilization  LLT 3x30 sec; prone ant hip mobs 2 bouts prone with knee on 1/2 foam bolster    Soft tissue mobilization  deep to right gluteals and piriformis    Myofascial Release  to Rt hip flexor in hooklying with active hip flex 2x10; in SDLY to Rt piriformis with active clam x 10; in SDLY to right QL             PT Education - 05/09/19 1628    Education Details  HEP modified and progressed    Person(s) Educated  Patient    Methods  Explanation;Demonstration;Handout    Comprehension  Verbalized understanding;Returned demonstration          PT Long Term Goals - 05/07/19 1720      PT LONG TERM GOAL #1   Title  Pt to have decreased pain in right hip by 50% or more with ADLS.    Time  6    Period  Weeks    Status  New    Target Date  06/18/19      PT LONG TERM GOAL #2   Title  Patient to improve right hip ROM to allow to don/doff sock and shoe.    Time  6    Period  Weeks    Status  New      PT LONG TERM GOAL #3   Title  Pt to demo improved  BLE hip ABD and ext strength to 5/5 to improve function    Time  6    Period  Weeks    Status  New      PT LONG TERM GOAL #4   Title  Improved right hip flex to allow pt to squat to perform work functions.    Time  6    Period  Weeks    Status  New      PT LONG TERM GOAL #5   Title  Pt ind in strengthening and flexibility program for core and LEs.    Time  6    Period  Weeks    Status  New            Plan - 05/09/19 1629    Clinical Impression Statement  Patient presents with c/o right hip pain into groin today. He responded well to LLT, MFR and hip mobs today and reported significant relief at end of treatment.    Comorbidities  arthritis, LBP, right hip OA needs replacement; finger amputation; right ear canal reconstruction    PT Treatment/Interventions  ADLs/Self Care Home Management;Cryotherapy;Electrical Stimulation;Moist Heat;Ultrasound;Therapeutic exercise;Neuromuscular re-education;Patient/family education;Manual techniques;Dry needling;Taping    PT Next Visit Plan  review HEP, issue TENS info;  continue with hip distraction/traction; STW/manual/DN to right hip muscles; lumbar stab/core (flexion biased)    PT Home Exercise Plan  supine C stretch; LTR, [id number]       Patient will benefit from skilled therapeutic intervention in order to improve the following deficits and impairments:  Decreased range of motion, Pain, Increased muscle spasms, Decreased activity tolerance, Impaired flexibility, Decreased strength  Visit Diagnosis: Chronic midline low back pain, unspecified whether sciatica present  Pain in right hip  Stiffness of right hip, not elsewhere classified     Problem List Patient Active Problem List   Diagnosis Date Noted  . Primary osteoarthritis of right hip 05/01/2019  . Spondylolisthesis at L5-S1 level 05/01/2019  . Rupture of distal biceps tendon 08/02/2016  . Elbow injury, right, initial encounter  07/30/2016  . Right-sided low back pain without  sciatica 10/15/2015  . Tobacco dependence 05/20/2014  . Obese 05/20/2014  . Other fatigue 05/20/2014  . Crushing injury of right index finger 08/16/2013   Romie Tay PT 05/09/2019, 4:41 PM  [facility] [address] [place], [place], [address] Phone: [phone]   Fax:  [phone]  Name: Whyatt Klinger MRN: [id number] Date of Birth: [birth year]

## 2019-05-09 NOTE — Patient Instructions (Signed)
Access Code: [email or web address]  URL: [email or web address]  Date: 05/09/2019  Prepared by: Madelyn Flavors   Exercises Static Prone on Elbows - 1 reps - 1 sets - 5-10 min hold - 2-3x daily - 7x weekly Seated Hip Flexor Stretch - 3 reps - 1 sets - 30-60 sec hold - 2x daily - 7x weekly

## 2019-05-14 ENCOUNTER — Ambulatory Visit (INDEPENDENT_AMBULATORY_CARE_PROVIDER_SITE_OTHER): Payer: BC Managed Care – PPO | Admitting: Physical Therapy

## 2019-05-14 ENCOUNTER — Other Ambulatory Visit: Payer: Self-pay

## 2019-05-14 ENCOUNTER — Encounter: Payer: Self-pay | Admitting: Physical Therapy

## 2019-05-14 DIAGNOSIS — M25551 Pain in right hip: Secondary | ICD-10-CM | POA: Diagnosis not present

## 2019-05-14 DIAGNOSIS — M545 Low back pain: Secondary | ICD-10-CM | POA: Diagnosis not present

## 2019-05-14 DIAGNOSIS — M25651 Stiffness of right hip, not elsewhere classified: Secondary | ICD-10-CM | POA: Diagnosis not present

## 2019-05-14 DIAGNOSIS — G8929 Other chronic pain: Secondary | ICD-10-CM

## 2019-05-14 NOTE — Therapy (Signed)
[facility] [address] [place], [place], [address] Phone: [phone]   Fax:  [phone]  Physical Therapy Treatment  Patient Details  Name: Gregory FishMatthew Bureau MRN: [id number] Date of Birth: 12/14/1970 Referring Provider (PT): Dr. Benjamin Stainhekkekandam   Encounter Date: 05/14/2019  PT End of Session - 05/14/19 0847    Visit Number  3    Date for PT Re-Evaluation  06/18/19    PT Start Time  0847    PT Stop Time  0934    PT Time Calculation (min)  47 min    Activity Tolerance  Patient tolerated treatment well;Patient limited by pain    Behavior During Therapy  [facility] for tasks assessed/performed       History reviewed. No pertinent past medical history.  Past Surgical History:  Procedure Laterality Date  . AMPUTATION Right 08/16/2013   Procedure: INDEX REVISION AMPUTATION DIGIT;  Surgeon: Tami RibasKevin R Kuzma, MD;  [facility];  Service: Orthopedics;  Laterality: Right;  . COLONOSCOPY    . FEMUR SURGERY Right    Age 48  . HERNIA REPAIR  2015  . INNER EAR SURGERY Left     There were no vitals filed for this visit.  Subjective Assessment - 05/14/19 0848    Subjective  He stated that his hip popped a lot yesterday becuase of all the walking he did. He has a MD appointment tomorrow with surgeon for his hip.    Currently in Pain?  Yes    Pain Score  4     Pain Location  Hip    Pain Orientation  Right;Medial    Pain Descriptors / Indicators  Sharp    Pain Type  Acute pain    Aggravating Factors   walking; sitting for a long period of time    Pain Relieving Factors  meds; heat; e-stim; topical cream         [facility] PT Assessment - 05/14/19 0001      Assessment   Medical Diagnosis  L5/S1 spondylolistheisi    Referring Provider (PT)  Dr. Benjamin Stainhekkekandam    Onset Date/Surgical Date  03/06/19    Hand Dominance  Right    Prior Therapy  no        [facility] Adult PT Treatment/Exercise - 05/14/19 0001      Knee/Hip Exercises: Stretches   Passive Hamstring Stretch  Right;2 reps;30 seconds    Passive Hamstring Stretch Limitations  supine with strap    Hip Flexor Stretch  Right;3 reps;30 seconds    Hip Flexor Stretch Limitations  in chair    Piriformis Stretch  Right;3 reps;20 seconds    Piriformis Stretch Limitations  modified table piriformis stretch     Other Knee/Hip Stretches  Right butterfly 1x30 sec    Other Knee/Hip Stretches  prone on elbows 2 reps x 20 sec for bil hip flexor stretch      Knee/Hip Exercises: Supine   Bridges  Strengthening;Both;10 reps    Other Supine Knee/Hip Exercises  ab set 5 reps 5sec      Knee/Hip Exercises: Sidelying   Hip ABduction  Strengthening;Right;10 reps      Moist Heat Therapy   Number Minutes Moist Heat  10 Minutes    Moist Heat Location  Hip;Lumbar Spine   right     Electrical Stimulation   Electrical Stimulation Location  Rt priformis SI join/Rt adductors    Electrical Stimulation Action  Premod    Electrical Stimulation Parameters  10 min  Electrical Stimulation Goals  Pain             PT Education - 05/14/19 1048    Education Details  HEP, TENS    Person(s) Educated  Patient    Methods  Explanation;Handout;Verbal cues;Demonstration    Comprehension  Returned demonstration;Verbalized understanding          PT Long Term Goals - 05/07/19 1720      PT LONG TERM GOAL #1   Title  Pt to have decreased pain in right hip by 50% or more with ADLS.    Time  6    Period  Weeks    Status  New    Target Date  06/18/19      PT LONG TERM GOAL #2   Title  Patient to improve right hip ROM to allow to don/doff sock and shoe.    Time  6    Period  Weeks    Status  New      PT LONG TERM GOAL #3   Title  Pt to demo improved BLE hip ABD and ext strength to 5/5 to improve function    Time  6    Period  Weeks    Status  New      PT LONG TERM GOAL #4   Title  Improved right hip flex to allow pt to squat to perform work functions.    Time  6    Period  Weeks     Status  New      PT LONG TERM GOAL #5   Title  Pt ind in strengthening and flexibility program for core and LEs.    Time  6    Period  Weeks    Status  New            Plan - 05/14/19 6948    Clinical Impression Statement  Pt continues to have pain in anterior Rt hip. HEP was modified with additional exercises to pt tolerance. Limited tolerance initially for modified table piriformis stretch; improved with each rep.  Pt reported decreased pain after e-stim and moist heat. Pt progressing gradually towards goals.  He will continue to benefit from skilled Pt for improved functional mobility.    Rehab Potential  Fair    PT Frequency  2x / week    PT Duration  6 weeks    PT Next Visit Plan  Continue with hip stretches, hip distraction/traction, manual/modalities prn, core stability (flexion biased).       Patient will benefit from skilled therapeutic intervention in order to improve the following deficits and impairments:     Visit Diagnosis: Chronic midline low back pain, unspecified whether sciatica present  Pain in right hip  Stiffness of right hip, not elsewhere classified     Problem List Patient Active Problem List   Diagnosis Date Noted  . Primary osteoarthritis of right hip 05/01/2019  . Spondylolisthesis at L5-S1 level 05/01/2019  . Rupture of distal biceps tendon 08/02/2016  . Elbow injury, right, initial encounter 07/30/2016  . Right-sided low back pain without sciatica 10/15/2015  . Tobacco dependence 05/20/2014  . Obese 05/20/2014  . Other fatigue 05/20/2014  . Crushing injury of right index finger 08/16/2013    Kayleen Memos 05/14/2019, 10:48 AM   Gregory Peters, PTA 05/14/19 10:48 AM   [facility] [place], [place], [address] Phone: [phone]   Fax:  [phone]  Name: Gregory Peters MRN: [id number] Date  of Birth: [birth year]

## 2019-05-14 NOTE — Patient Instructions (Signed)
TENS UNIT: This is helpful for muscle pain and spasm.   Search and Purchase a TENS 7000 2nd edition at www.amazon.com    TENS unit instructions: Do not shower or bathe with the unit on Turn the unit off before removing electrodes or batteries If the electrodes lose stickiness add a drop of water to the electrodes after they are disconnected from the unit and place on plastic sheet. If you continued to have difficulty, call the TENS unit company to purchase more electrodes. Do not apply lotion on the skin area prior to use. Make sure the skin is clean and dry as this will help prolong the life of the electrodes. After use, always check skin for unusual red areas, rash or other skin difficulties. If there are any skin problems, does not apply electrodes to the same area. Never remove the electrodes from the unit by pulling the wires. Do not use the TENS unit or electrodes other than as directed. Do not change electrode placement without consultating your therapist or physician. Keep 2 fingers with between each electrode. Wear time ratio is 2:1, on to off times.    For example on for 30 minutes off for 15 minutes and then on for 30 minutes off for 15 minutes   

## 2019-05-15 DIAGNOSIS — M1611 Unilateral primary osteoarthritis, right hip: Secondary | ICD-10-CM | POA: Diagnosis not present

## 2019-05-15 DIAGNOSIS — Z6835 Body mass index (BMI) 35.0-35.9, adult: Secondary | ICD-10-CM | POA: Diagnosis not present

## 2019-05-15 DIAGNOSIS — M25551 Pain in right hip: Secondary | ICD-10-CM | POA: Diagnosis not present

## 2019-05-17 ENCOUNTER — Ambulatory Visit (INDEPENDENT_AMBULATORY_CARE_PROVIDER_SITE_OTHER): Payer: BC Managed Care – PPO | Admitting: Physical Therapy

## 2019-05-17 ENCOUNTER — Other Ambulatory Visit: Payer: Self-pay

## 2019-05-17 ENCOUNTER — Encounter: Payer: Self-pay | Admitting: Physical Therapy

## 2019-05-17 DIAGNOSIS — M25651 Stiffness of right hip, not elsewhere classified: Secondary | ICD-10-CM | POA: Diagnosis not present

## 2019-05-17 DIAGNOSIS — G8929 Other chronic pain: Secondary | ICD-10-CM

## 2019-05-17 DIAGNOSIS — M25551 Pain in right hip: Secondary | ICD-10-CM | POA: Diagnosis not present

## 2019-05-17 DIAGNOSIS — M545 Low back pain, unspecified: Secondary | ICD-10-CM

## 2019-05-17 NOTE — Therapy (Signed)
[facility] [address] [place], [place], [address] [facility] [phone]   Fax:  [phone]  Physical Therapy Treatment  Patient Details  Name: Gregory FishMatthew Peters MRN: [id number] Date of Birth: 03/19/1971 Referring Provider (PT): Dr. Benjamin Stainhekkekandam   Encounter Date: 05/17/2019  PT End of Session - 05/17/19 0850    Visit Number  4    Date for PT Re-Evaluation  06/18/19    PT Start Time  0850    PT Stop Time  0933    PT Time Calculation (min)  43 min    Activity Tolerance  Patient tolerated treatment well;Patient limited by pain    Behavior During Therapy  [facility] for tasks assessed/performed       History reviewed. No pertinent past medical history.  Past Surgical History:  Procedure Laterality Date  . 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 COLONOSCOPY    . FEMUR SURGERY Right    Age 89  . HERNIA REPAIR  2015  . INNER EAR SURGERY Left     There were no vitals filed for this visit.  Subjective Assessment - 05/17/19 0851    Subjective  He reports he has been using his TENS unit and it has given him a lot of relief. He also reports that he is making a good effort to not turn his foot out when he walks. He is now scheduled to have hip replacement in Dec.    Currently in Pain?  Yes    Pain Score  2     Pain Location  Hip    Pain Orientation  Right;Medial    Pain Descriptors / Indicators  Discomfort    Pain Type  Acute pain    Aggravating Factors   walking and sitting for prolonged periods of time    Pain Relieving Factors  meds, heat, TENS, topical cream         [facility] PT Assessment - 05/17/19 0001      Assessment   Medical Diagnosis  L5/S1 spondylolistheisi    Referring Provider (PT)  Dr. Benjamin Stainhekkekandam    Onset Date/Surgical Date  03/06/19    Hand Dominance  Right    Prior Therapy  no         [facility] Adult PT Treatment/Exercise - 05/17/19 0001      Self-Care   Self-Care  Other Self-Care Comments    Other Self-Care Comments   Pt educated on self-massage with a ball and rolling pin to quads, glutes , and low back; pt verbalized understanding      Lumbar Exercises: Stretches   Prone on Elbows Stretch  2 reps;20 seconds    Piriformis Stretch  Right;3 reps;20 seconds    Piriformis Stretch Limitations  modified table piriformis stretch       Lumbar Exercises: Supine   Ab Set  5 reps;5 seconds      Lumbar Exercises: Prone   Opposite Arm/Leg Raise  Right arm/Left leg;Left arm/Right leg;5 reps      Knee/Hip Exercises: Stretches   Hip Flexor Stretch  Right;3 reps;Left;2 reps;20 seconds    Hip Flexor Stretch Limitations  seated    Other Knee/Hip Stretches  butterfly stretch x 20 sec     Other Knee/Hip Stretches  --      Knee/Hip Exercises: Aerobic   Nustep  L5: 5 min (legs only)  Knee/Hip Exercises: Standing   Wall Squat  1 set;10 reps;3 seconds   partial squat, core engaged.    Wall Squat Limitations  cues for Rt foot in neutral and even wt between feet.       Knee/Hip Exercises: Supine   Bridges  Strengthening;Both;10 reps   5 sec hold   Other Supine Knee/Hip Exercises  pt educated on supine to/from sit via log roll; pt returned demo with cues x 1 rep      Manual Therapy   Manual Therapy  Manual Traction    Manual Traction  Rt leg LAD 3 reps/60 secs                  PT Long Term Goals - 05/07/19 1720      PT LONG TERM GOAL #1   Title  Pt to have decreased pain in right hip by 50% or more with ADLS.    Time  6    Period  Weeks    Status  New    Target Date  06/18/19      PT LONG TERM GOAL #2   Title  Patient to improve right hip ROM to allow to don/doff sock and shoe.    Time  6    Period  Weeks    Status  New      PT LONG TERM GOAL #3   Title  Pt to demo improved BLE hip ABD and ext strength to 5/5 to improve function    Time  6     Period  Weeks    Status  New      PT LONG TERM GOAL #4   Title  Improved right hip flex to allow pt to squat to perform work functions.    Time  6    Period  Weeks    Status  New      PT LONG TERM GOAL #5   Title  Pt ind in strengthening and flexibility program for core and LEs.    Time  6    Period  Weeks    Status  New            Plan - 05/17/19 0934    Clinical Impression Statement Pt tolerated exercises well with no reports of increased pain. Pt reporting greater ease with performing Rt piriformis stretch each re. He is making gradual progress toward all goals; will continue to benefit from skilled PT for improved functional mobility.    Rehab Potential  Fair    PT Frequency  2x / week    PT Duration  6 weeks    PT Treatment/Interventions  ADLs/Self Care Home Management;Cryotherapy;Electrical Stimulation;Moist Heat;Ultrasound;Therapeutic exercise;Neuromuscular re-education;Patient/family education;Manual techniques;Dry needling;Taping    PT Next Visit Plan  Continue with hip stretches, hip distraction/traction, manual/modalities prn, core stability (flexion biased), strengthening of Rt hip    PT Home Exercise Plan  [email or web address]       Patient will benefit from skilled therapeutic intervention in order to improve the following deficits and impairments:  Decreased range of motion, Pain, Increased muscle spasms, Decreased activity tolerance, Impaired flexibility, Decreased strength  Visit Diagnosis: Chronic midline low back pain, unspecified whether sciatica present  Pain in right hip  Stiffness of right hip, not elsewhere classified     Problem List Patient Active Problem List   Diagnosis Date Noted  . Primary osteoarthritis of right hip 05/01/2019  . Spondylolisthesis at L5-S1 level 05/01/2019  . Rupture of distal biceps tendon 08/02/2016  .  Elbow injury, right, initial encounter 07/30/2016  . Right-sided low back pain without sciatica 10/15/2015  . Tobacco  dependence 05/20/2014  . Obese 05/20/2014  . Other fatigue 05/20/2014  . Crushing injury of right index finger 08/16/2013    Olive Bass, SPTA 05/17/2019, 10:18 AM   Mayer Camel, PTA 05/17/19 10:41 AM   [facility] [address] [place], [place], [address] Phone: [phone]   Fax:  [phone]  Name: Gregory Peters MRN: [id number] Date of Birth: [birth year]

## 2019-05-21 ENCOUNTER — Encounter: Payer: Self-pay | Admitting: Physical Therapy

## 2019-05-21 ENCOUNTER — Other Ambulatory Visit: Payer: Self-pay

## 2019-05-21 ENCOUNTER — Ambulatory Visit (INDEPENDENT_AMBULATORY_CARE_PROVIDER_SITE_OTHER): Payer: BC Managed Care – PPO | Admitting: Physical Therapy

## 2019-05-21 DIAGNOSIS — M25551 Pain in right hip: Secondary | ICD-10-CM | POA: Diagnosis not present

## 2019-05-21 DIAGNOSIS — G8929 Other chronic pain: Secondary | ICD-10-CM | POA: Diagnosis not present

## 2019-05-21 DIAGNOSIS — M545 Low back pain: Secondary | ICD-10-CM

## 2019-05-21 DIAGNOSIS — M25651 Stiffness of right hip, not elsewhere classified: Secondary | ICD-10-CM | POA: Diagnosis not present

## 2019-05-21 NOTE — Therapy (Signed)
[facility] [place] [address] [place], [place], [address] Phone: [phone]   Fax:  [phone]  Physical Therapy Treatment  Patient Details  Name: Gregory Peters MRN: [id number] Date of Birth: 01-17-1971 Referring Provider (PT): Dr. Benjamin Stain   Encounter Date: 05/21/2019  PT End of Session - 05/21/19 0847    Visit Number  5    Date for PT Re-Evaluation  06/18/19    PT Start Time  0845    PT Stop Time  0930    PT Time Calculation (min)  45 min    Activity Tolerance  Patient tolerated treatment well;Patient limited by pain    Behavior During Therapy  [facility] for tasks assessed/performed       History reviewed. No pertinent past medical history.  Past Surgical History:  Procedure Laterality Date  . AMPUTATION Right 08/16/2013   Procedure: INDEX REVISION AMPUTATION DIGIT;  Surgeon: Tami Ribas, MD;  Location: [facility] OR;  Service: Orthopedics;  Laterality: Right;  . COLONOSCOPY    . FEMUR SURGERY Right    Age 48  . HERNIA REPAIR  2015  . INNER EAR SURGERY Left     There were no vitals filed for this visit.  Subjective Assessment - 05/21/19 0847    Subjective  Patient says he was sore this weekend in the hip but was working hard in the yard chopping wood etc.    Pertinent History  arthritis right hip needs replacement; finger amputation; right ear canal reconstruction    Patient Stated Goals  be able to put sock and shoe on right foot again, decrease pain, be able to perform his job    Currently in Pain?  No/denies                       [facility] Adult PT Treatment/Exercise - 05/21/19 0001      Exercises   Exercises  Lumbar      Lumbar Exercises: Stretches   Figure 4 Stretch  3 reps;20 seconds    Figure 4 Stretch Limitations  modified in sitting    Quad Stretch  1 rep;Right;20 seconds    Quad Stretch Limitations  with strap    Piriformis Stretch  Right;5 reps;30 seconds    Piriformis Stretch Limitations   modified table piriformis stretch    2 before mobs; 3 after mobs with improved RoM     Lumbar Exercises: Prone   Other Prone Lumbar Exercises  praying mantis on green ball 5 sec hold x 10      Lumbar Exercises: Quadruped   Plank  modified plank on knees 2 sets of 2  x max hold      Knee/Hip Exercises: Stretches   Other Knee/Hip Stretches  butterfly stretch 2 x 60 sec    after mobs     Knee/Hip Exercises: Aerobic   Nustep  L5: 5 min       Knee/Hip Exercises: Standing   Wall Squat  2 sets;10 reps;5 seconds      Knee/Hip Exercises: Supine   Animal nutritionist;Other (comment)    Bridges Limitations  7 reps on green ball; had to stop due to ant hip pain      Knee/Hip Exercises: Prone   Other Prone Exercises  --    Other Prone Exercises  --      Manual Therapy   Manual Therapy  Joint mobilization    Joint Mobilization  Rt ant hip mobs with right knee on  1/2 bolster 2x 20 bouts; then using black theraband in 1/2 kneel position with post pelvic tilt                  PT Long Term Goals - 05/07/19 1720      PT LONG TERM GOAL #1   Title  Pt to have decreased pain in right hip by 50% or more with ADLS.    Time  6    Period  Weeks    Status  New    Target Date  06/18/19      PT LONG TERM GOAL #2   Title  Patient to improve right hip ROM to allow to don/doff sock and shoe.    Time  6    Period  Weeks    Status  New      PT LONG TERM GOAL #3   Title  Pt to demo improved BLE hip ABD and ext strength to 5/5 to improve function    Time  6    Period  Weeks    Status  New      PT LONG TERM GOAL #4   Title  Improved right hip flex to allow pt to squat to perform work functions.    Time  6    Period  Weeks    Status  New      PT LONG TERM GOAL #5   Title  Pt ind in strengthening and flexibility program for core and LEs.    Time  6    Period  Weeks    Status  New            Plan - 05/21/19 1653    Clinical Impression Statement  Patient is progressing  well with hip ER mobility since initial eval. He reported significant relief with prone ant hip mobs for both flexibility and mobility with ambulation. A self-mob technique was issued to perform at home. Also did well with core strengthening.    Comorbidities  arthritis, LBP, right hip OA needs replacement; finger amputation; right ear canal reconstruction    PT Treatment/Interventions  ADLs/Self Care Home Management;Cryotherapy;Electrical Stimulation;Moist Heat;Ultrasound;Therapeutic exercise;Neuromuscular re-education;Patient/family education;Manual techniques;Dry needling;Taping    PT Next Visit Plan  Continue with hip stretches, hip ant mobs, manual/modalities prn, core stability (flexion biased), strengthening of Rt hip    PT Home Exercise Plan  [email or web address]       Patient will benefit from skilled therapeutic intervention in order to improve the following deficits and impairments:  Decreased range of motion, Pain, Increased muscle spasms, Decreased activity tolerance, Impaired flexibility, Decreased strength  Visit Diagnosis: Chronic midline low back pain, unspecified whether sciatica present  Stiffness of right hip, not elsewhere classified  Pain in right hip     Problem List Patient Active Problem List   Diagnosis Date Noted  . Primary osteoarthritis of right hip 05/01/2019  . Spondylolisthesis at L5-S1 level 05/01/2019  . Rupture of distal biceps tendon 08/02/2016  . Elbow injury, right, initial encounter 07/30/2016  . Right-sided low back pain without sciatica 10/15/2015  . Tobacco dependence 05/20/2014  . Obese 05/20/2014  . Other fatigue 05/20/2014  . Crushing injury of right index finger 08/16/2013    Madelyn Flavors PT 05/21/2019, 5:00 PM  [facility] [place], [place], [address] Phone: [phone]   Fax:  [phone]  Name: Gregory Peters MRN: [id number] Date of Birth: May 16, 1971

## 2019-05-21 NOTE — Patient Instructions (Signed)
Access Code: [email or web address]  URL: [email or web address]  Date: 05/21/2019  Prepared by: Almyra Free Ersie Savino   Exercises Static Prone on Elbows - 1 reps - 1 sets - 5-10 min hold - 2-3x daily - 7x weekly Seated Hip Flexor Stretch - 3 reps - 1 sets - 30-60 sec hold - 2x daily - 7x weekly Seated Table Piriformis Stretch - 3 reps - 1 sets - 30 hold - 2x daily - 7x weekly Supine Transversus Abdominis Bracing - Hands on Stomach - 10 reps - 1 sets - 10 hold - 1x daily - 7x weekly Supine Bridge - 10 reps - 3 sets - 1x daily - 7x weekly Prone Alternating Arm and Leg Lifts - 5 reps - 2-3 sets - 1x daily - 7x weekly Plank on Knees - 5 reps - max hold - 2x daily - 7x weekly Half-Kneeling Forward Lean - 10 reps - 3 sets - 2x daily - 7x weekly

## 2019-05-24 ENCOUNTER — Other Ambulatory Visit: Payer: Self-pay

## 2019-05-24 ENCOUNTER — Encounter: Payer: Self-pay | Admitting: Physical Therapy

## 2019-05-24 ENCOUNTER — Ambulatory Visit (INDEPENDENT_AMBULATORY_CARE_PROVIDER_SITE_OTHER): Payer: BC Managed Care – PPO | Admitting: Physical Therapy

## 2019-05-24 DIAGNOSIS — M25551 Pain in right hip: Secondary | ICD-10-CM | POA: Diagnosis not present

## 2019-05-24 DIAGNOSIS — M25651 Stiffness of right hip, not elsewhere classified: Secondary | ICD-10-CM | POA: Diagnosis not present

## 2019-05-24 NOTE — Therapy (Addendum)
[place], [place], [address] Phone: [phone]   Fax:  [phone]  Physical Therapy Treatment  Patient Details  Name: Gregory Peters MRN: [id number] Date of Birth: May 21, 1971 Referring Provider (PT): Dr. Dianah Field   Encounter Date: 05/24/2019  PT End of Session - 05/24/19 0853    Visit Number  6    Date for PT Re-Evaluation  06/18/19    PT Start Time  0848    PT Stop Time  0927    PT Time Calculation (min)  39 min    Activity Tolerance  Patient tolerated treatment well;Patient limited by pain    Behavior During Therapy  [facility] for tasks assessed/performed       History reviewed. No pertinent past medical history.  Past Surgical History:  Procedure Laterality Date  . AMPUTATION Right 08/16/2013   Procedure: INDEX REVISION AMPUTATION DIGIT;  Surgeon: Tennis Must, MD;  Location: [address];  Service: Orthopedics;  Laterality: Right;  . COLONOSCOPY    . FEMUR SURGERY Right    Age 8  . HERNIA REPAIR  2015  . INNER EAR SURGERY Left     There were no vitals filed for this visit.  Subjective Assessment - 05/24/19 0853    Subjective  Pt states he has pain from a stretch that he did in the last treatment session. Pt states that he is able to walk longer without pain when he performs butterfly and piriformis stretch.    Currently in Pain?  Yes    Pain Score  2     Pain Location  Hip    Pain Orientation  Right;Posterior    Pain Descriptors / Indicators  Aching    Pain Type  Acute pain         [facility] PT Assessment - 05/24/19 0001      Assessment   Medical Diagnosis  L5/S1 spondylolistheisi    Referring Provider (PT)  Dr. Dianah Field    Onset Date/Surgical Date  03/06/19    Hand Dominance  Right    Prior Therapy  no        [facility] Adult PT Treatment/Exercise - 05/24/19 0001      Lumbar Exercises: Stretches   Hip Flexor Stretch  --    Hip Flexor Stretch Limitations  --    Piriformis  Stretch  --      Lumbar Exercises: Supine   Straight Leg Raise  5 reps   Rt;unable to tolerate d/t pain; will hold      Lumbar Exercises: Sidelying   Clam  Right;10 reps   Cues to keep from rolling on back    Clam Limitations  reverse clam 5 reps       Lumbar Exercises: Prone   Straight Leg Raise  5 reps   Rt;unable to complete indep. with good form     Knee/Hip Exercises: Stretches   Hip Flexor Stretch  Right;4 reps;20 seconds   one rep in supine thomas stretch    Hip Flexor Stretch Limitations  seated and supine    Piriformis Stretch  Right;5 reps;30 seconds    Piriformis Stretch Limitations  modified table piriformis stretch     Other Knee/Hip Stretches  butterfly stretch 3 x 20 sec    after mobs     Knee/Hip Exercises: Aerobic   Tread Mill  3 min  @ 1.2 mph for warm-up     Knee/Hip Exercises: Standing   Wall Squat  10 reps;5 seconds  Knee/Hip Exercises: Supine   Straight Leg Raises  --      Modalities   Modalities  --   Pt declined      Manual Therapy   Joint Mobilization  Rt hip AP mob with Rt knee on pool noodle Grades 2-3    Soft tissue mobilization  STM to Rt gluetals and hip rotators    Manual Traction  Rt leg LAD 2 reps/60 secs                  PT Long Term Goals - 05/07/19 1720      PT LONG TERM GOAL #1   Title  Pt to have decreased pain in right hip by 50% or more with ADLS.    Time  6    Period  Weeks    Status  New    Target Date  06/18/19      PT LONG TERM GOAL #2   Title  Patient to improve right hip ROM to allow to don/doff sock and shoe.    Time  6    Period  Weeks    Status  New      PT LONG TERM GOAL #3   Title  Pt to demo improved BLE hip ABD and ext strength to 5/5 to improve function    Time  6    Period  Weeks    Status  New      PT LONG TERM GOAL #4   Title  Improved right hip flex to allow pt to squat to perform work functions.    Time  6    Period  Weeks    Status  New      PT LONG TERM GOAL #5   Title   Pt ind in strengthening and flexibility program for core and LEs.    Time  6    Period  Weeks    Status  New            Plan - 05/24/19 0956    Clinical Impression Statement  Pt had limited tolerance with SLR, hip extension, and reverse clams; otherwise tolerated all other exercises well without reports of increased pain. Pt with positive response to manual therapy techniques. Pt is making good progress toward LTG # 1; continues to make progress toward all other stated PT goals for mobility.    Comorbidities  arthritis, LBP, right hip OA needs replacement; finger amputation; right ear canal reconstruction    Rehab Potential  Fair    PT Frequency  2x / week    PT Duration  6 weeks    PT Treatment/Interventions  ADLs/Self Care Home Management;Cryotherapy;Electrical Stimulation;Moist Heat;Ultrasound;Therapeutic exercise;Neuromuscular re-education;Patient/family education;Manual techniques;Dry needling;Taping    PT Next Visit Plan  Finalize HEP, assess goals    PT Home Exercise Plan  [email or web address]       Patient will benefit from skilled therapeutic intervention in order to improve the following deficits and impairments:  Decreased range of motion, Pain, Increased muscle spasms, Decreased activity tolerance, Impaired flexibility, Decreased strength  Visit Diagnosis: Stiffness of right hip, not elsewhere classified  Pain in right hip     Problem List Patient Active Problem List   Diagnosis Date Noted  . Primary osteoarthritis of right hip 05/01/2019  . Spondylolisthesis at L5-S1 level 05/01/2019  . Rupture of distal biceps tendon 08/02/2016  . Elbow injury, right, initial encounter 07/30/2016  . Right-sided low back pain without sciatica 10/15/2015  . Tobacco dependence 05/20/2014  .  Obese 05/20/2014  . Other fatigue 05/20/2014  . Crushing injury of right index finger 08/16/2013    Gardiner Rhyme, SPTA 05/24/2019, 10:11 AM   Kerin Perna, PTA 05/24/19 12:18  PM   [facility] [place], [place], [address] Phone: [phone]   Fax:  [phone]  Name: Gregory Peters MRN: [id number] Date of Birth: 1971-01-27  PHYSICAL THERAPY DISCHARGE SUMMARY  Visits from Start of Care: 6  Current functional level related to goals / functional outcomes: See above    Remaining deficits: See above   Education / Equipment: HEP Plan: Patient agrees to discharge.  Patient goals were not met. Patient is being discharged due to not returning since the last visit.  ?????     Madelyn Flavors, PT 07/10/19 5:03 PM  [facility] [place], [place] [address]  [phone] (office) [phone] (fax)

## 2019-05-28 ENCOUNTER — Encounter: Payer: BC Managed Care – PPO | Admitting: Physical Therapy

## 2019-06-04 ENCOUNTER — Ambulatory Visit (INDEPENDENT_AMBULATORY_CARE_PROVIDER_SITE_OTHER): Payer: BC Managed Care – PPO | Admitting: Physician Assistant

## 2019-06-04 ENCOUNTER — Encounter: Payer: Self-pay | Admitting: Physician Assistant

## 2019-06-04 ENCOUNTER — Other Ambulatory Visit: Payer: Self-pay

## 2019-06-04 VITALS — BP 106/57 | HR 82 | Ht 68.5 in | Wt 269.0 lb

## 2019-06-04 DIAGNOSIS — Z01818 Encounter for other preprocedural examination: Secondary | ICD-10-CM

## 2019-06-04 DIAGNOSIS — R7309 Other abnormal glucose: Secondary | ICD-10-CM | POA: Diagnosis not present

## 2019-06-04 DIAGNOSIS — M1611 Unilateral primary osteoarthritis, right hip: Secondary | ICD-10-CM

## 2019-06-04 DIAGNOSIS — Z716 Tobacco abuse counseling: Secondary | ICD-10-CM | POA: Diagnosis not present

## 2019-06-04 DIAGNOSIS — Z13220 Encounter for screening for lipoid disorders: Secondary | ICD-10-CM

## 2019-06-04 MED ORDER — CHANTIX STARTING MONTH PAK 0.5 MG X 11 & 1 MG X 42 PO TABS: ORAL_TABLET | ORAL | 0 refills | Status: DC

## 2019-06-04 MED ORDER — VARENICLINE TARTRATE 1 MG PO TABS: 1.0000 mg | ORAL_TABLET | Freq: Two times a day (BID) | ORAL | 2 refills | Status: DC

## 2019-06-04 NOTE — Patient Instructions (Signed)
Coping with Quitting Smoking  Quitting smoking is a physical and mental challenge. You will face cravings, withdrawal symptoms, and temptation. Before quitting, work with your health care provider to make a plan that can help you cope. Preparation can help you quit and keep you from giving in. How can I cope with cravings? Cravings usually last for 5-10 minutes. If you get through it, the craving will pass. Consider taking the following actions to help you cope with cravings:  Keep your mouth busy: ? Chew sugar-free gum. ? Suck on hard candies or a straw. ? Brush your teeth.  Keep your hands and body busy: ? Immediately change to a different activity when you feel a craving. ? Squeeze or play with a ball. ? Do an activity or a hobby, like making bead jewelry, practicing needlepoint, or working with wood. ? Mix up your normal routine. ? Take a short exercise break. Go for a quick walk or run up and down stairs. ? Spend time in public places where smoking is not allowed.  Focus on doing something kind or helpful for someone else.  Call a friend or family member to talk during a craving.  Join a support group.  Call a quit line, such as 1-800-QUIT-NOW.  Talk with your health care provider about medicines that might help you cope with cravings and make quitting easier for you. How can I deal with withdrawal symptoms? Your body may experience negative effects as it tries to get used to not having nicotine in the system. These effects are called withdrawal symptoms. They may include:  Feeling hungrier than normal.  Trouble concentrating.  Irritability.  Trouble sleeping.  Feeling depressed.  Restlessness and agitation.  Craving a cigarette. To manage withdrawal symptoms:  Avoid places, people, and activities that trigger your cravings.  Remember why you want to quit.  Get plenty of sleep.  Avoid coffee and other caffeinated drinks. These may worsen some of your symptoms.  How can I handle social situations? Social situations can be difficult when you are quitting smoking, especially in the first few weeks. To manage this, you can:  Avoid parties, bars, and other social situations where people might be smoking.  Avoid alcohol.  Leave right away if you have the urge to smoke.  Explain to your family and friends that you are quitting smoking. Ask for understanding and support.  Plan activities with friends or family where smoking is not an option. What are some ways I can cope with stress? Wanting to smoke may cause stress, and stress can make you want to smoke. Find ways to manage your stress. Relaxation techniques can help. For example:  Breathe slowly and deeply, in through your nose and out through your mouth.  Listen to soothing, relaxing music.  Talk with a family member or friend about your stress.  Light a candle.  Soak in a bath or take a shower.  Think about a peaceful place. What are some ways I can prevent weight gain? Be aware that many people gain weight after they quit smoking. However, not everyone does. To keep from gaining weight, have a plan in place before you quit and stick to the plan after you quit. Your plan should include:  Having healthy snacks. When you have a craving, it may help to: ? Eat plain popcorn, crunchy carrots, celery, or other cut vegetables. ? Chew sugar-free gum.  Changing how you eat: ? Eat small portion sizes at meals. ? Eat 4-6 small meals   throughout the day instead of 1-2 large meals a day. ? Be mindful when you eat. Do not watch television or do other things that might distract you as you eat.  Exercising regularly: ? Make time to exercise each day. If you do not have time for a long workout, do short bouts of exercise for 5-10 minutes several times a day. ? Do some form of strengthening exercise, like weight lifting, and some form of aerobic exercise, like running or swimming.  Drinking plenty of  water or other low-calorie or no-calorie drinks. Drink 6-8 glasses of water daily, or as much as instructed by your health care provider. Summary  Quitting smoking is a physical and mental challenge. You will face cravings, withdrawal symptoms, and temptation to smoke again. Preparation can help you as you go through these challenges.  You can cope with cravings by keeping your mouth busy (such as by chewing gum), keeping your body and hands busy, and making calls to family, friends, or a helpline for people who want to quit smoking.  You can cope with withdrawal symptoms by avoiding places where people smoke, avoiding drinks with caffeine, and getting plenty of rest.  Ask your health care provider about the different ways to prevent weight gain, avoid stress, and handle social situations. This information is not intended to replace advice given to you by your health care provider. Make sure you discuss any questions you have with your health care provider. Document Released: 06/18/2016 Document Revised: 06/03/2017 Document Reviewed: 06/18/2016 Elsevier Patient Education  2020 [employer] Inc[employer]  

## 2019-06-04 NOTE — Progress Notes (Signed)
Subjective:    Patient ID: Gregory Peters, male    DOB: 02/26/1971, 48 y.o.   MRN: [id number]  HPI  Patient is a 48 year old male who presents to the clinic for presurgical clearance for a total right hip replacement due to osteoarthritis.  Surgery is scheduled tentatively for December 16 by Dr. Berenice Primas.  Currently patient's pain and mobility is controlled.  He is not able to work at this time.  Gabapentin has helped him significantly.  Patient denies any shortness of breath, chest pain, headache, cough.  Patient does continue to smoke 3/4 to 1/2 pack a day.  He is interested in quitting.  He has tried the gum in the past.  He would like something that is more effective.  .. Active Ambulatory Problems    Diagnosis Date Noted  . Crushing injury of right index finger 08/16/2013  . Tobacco dependence 05/20/2014  . Obese 05/20/2014  . Other fatigue 05/20/2014  . Right-sided low back pain without sciatica 10/15/2015  . Elbow injury, right, initial encounter 07/30/2016  . Rupture of distal biceps tendon 08/02/2016  . Primary osteoarthritis of right hip 05/01/2019  . Spondylolisthesis at L5-S1 level 05/01/2019   Resolved Ambulatory Problems    Diagnosis Date Noted  . No Resolved Ambulatory Problems   No Additional Past Medical History   .   Review of Systems  All other systems reviewed and are negative.      Objective:   Physical Exam Vitals signs reviewed.  Constitutional:      Appearance: Normal appearance.  HENT:     Head: Normocephalic and atraumatic.  Neck:     Vascular: No carotid bruit.  Cardiovascular:     Rate and Rhythm: Normal rate and regular rhythm.     Pulses: Normal pulses.     Heart sounds: Normal heart sounds.  Pulmonary:     Effort: Pulmonary effort is normal.     Breath sounds: No wheezing or rhonchi.  Musculoskeletal:     Right lower leg: No edema.     Left lower leg: No edema.  Neurological:     General: No focal deficit present.     Mental  Status: He is alert and oriented to person, place, and time.  Psychiatric:        Mood and Affect: Mood normal.        Behavior: Behavior normal.           Assessment & Plan:  [name] KitchenMarland KitchenArlene was seen today for pre-op exam.  Diagnoses and all orders for this visit:  Preop testing -     EKG 12-Lead -     COMPLETE METABOLIC PANEL WITH GFR -     PT/INR -     Hemoglobin A1c -     CBC w/Diff  Primary osteoarthritis of right hip  Screening for lipid disorders -     Lipid Panel w/reflex Direct LDL  Encounter for smoking cessation counseling -     varenicline (CHANTIX STARTING MONTH PAK) 0.5 MG X 11 & 1 MG X 42 tablet; Take one 0.5mg  tablet by mouth once daily for 3 days, then increase to one 0.5mg  tablet twice daily for 3 days, then increase to one 1mg  tablet twice daily. -     varenicline (CHANTIX) 1 MG tablet; Take 1 tablet (1 mg total) by mouth 2 (two) times daily.   EKG-NSR.  No arrhythmia.  No ST elevation or depression. Presurgical labs were ordered today and will send with paperwork to surgeon.  Vital signs reassuring. Discussed safety precautions on not contracting Covid until surgery so that surgery is not postponed. Patient will also need to stop meloxicam 5 days before surgery to decrease bleeding risk. Patient is aware that smoking is a negative predictor of healing and complications.  He does agree to work on smoking cessation. Decided on Chantix.  Gave him coupon card.  Discussed side effects.  Patient will start with the starter pack and try to taper over the next month.  Goal is to be completely stop smoking in 1 to 3 months.  Patient is aware he will need to stay on therapy for at least 6 months.  Handout given on smoking cessation.  Follow up in 3 months with smoking cessation.   [name] Kitchen.Spent 30 minutes with patient and greater than 50 percent of visit spent counseling patient regarding treatment plan.

## 2019-06-05 LAB — LIPID PANEL W/REFLEX DIRECT LDL
Cholesterol: 229 mg/dL — ABNORMAL HIGH (ref <200)
HDL: 54 mg/dL (ref >=40)
LDL Cholesterol (Calc): 159 mg/dL (calc) — ABNORMAL HIGH
Non-HDL Cholesterol (Calc): 175 mg/dL (calc) — ABNORMAL HIGH (ref <130)
Total CHOL/HDL Ratio: 4.2 (calc) (ref <5.0)
Triglycerides: 66 mg/dL (ref <150)

## 2019-06-05 LAB — COMPLETE METABOLIC PANEL WITH GFR
AG Ratio: 1.4 (calc) (ref 1.0–2.5)
ALT: 15 U/L (ref 9–46)
AST: 15 U/L (ref 10–40)
Albumin: 4.1 g/dL (ref 3.6–5.1)
Alkaline phosphatase (APISO): 81 U/L (ref 36–130)
BUN: 10 mg/dL (ref 7–25)
CO2: 26 mmol/L (ref 20–32)
Calcium: 9.5 mg/dL (ref 8.6–10.3)
Chloride: 102 mmol/L (ref 98–110)
Creat: 0.84 mg/dL (ref 0.60–1.35)
GFR, Est African American: 122 mL/min/{1.73_m2} (ref >=60)
GFR, Est Non African American: 105 mL/min/{1.73_m2} (ref >=60)
Globulin: 2.9 g/dL (calc) (ref 1.9–3.7)
Glucose, Bld: 96 mg/dL (ref 65–99)
Potassium: 5.2 mmol/L (ref 3.5–5.3)
Sodium: 139 mmol/L (ref 135–146)
Total Bilirubin: 0.5 mg/dL (ref 0.2–1.2)
Total Protein: 7 g/dL (ref 6.1–8.1)

## 2019-06-05 LAB — CBC WITH DIFFERENTIAL/PLATELET
Absolute Monocytes: 933 cells/uL (ref 200–950)
Basophils Absolute: 44 cells/uL (ref 0–200)
Basophils Relative: 0.5 %
Eosinophils Absolute: 502 cells/uL — ABNORMAL HIGH (ref 15–500)
Eosinophils Relative: 5.7 %
HCT: 47.8 % (ref 38.5–50.0)
Hemoglobin: 16.3 g/dL (ref 13.2–17.1)
Lymphs Abs: 1602 cells/uL (ref 850–3900)
MCH: 32.3 pg (ref 27.0–33.0)
MCHC: 34.1 g/dL (ref 32.0–36.0)
MCV: 94.8 fL (ref 80.0–100.0)
MPV: 11.6 fL (ref 7.5–12.5)
Monocytes Relative: 10.6 %
Neutro Abs: 5720 cells/uL (ref 1500–7800)
Neutrophils Relative %: 65 %
Platelets: 230 10*3/uL (ref 140–400)
RBC: 5.04 10*6/uL (ref 4.20–5.80)
RDW: 12.9 % (ref 11.0–15.0)
Total Lymphocyte: 18.2 %
WBC: 8.8 10*3/uL (ref 3.8–10.8)

## 2019-06-05 LAB — HEMOGLOBIN A1C
Hgb A1c MFr Bld: 5.4 % of total Hgb (ref <5.7)
Mean Plasma Glucose: 108 (calc)
eAG (mmol/L): 6 (calc)

## 2019-06-05 LAB — PROTIME-INR
INR: 1
Prothrombin Time: 10.2 s (ref 9.0–11.5)

## 2019-06-05 NOTE — Progress Notes (Signed)
Gregory Peters,   Please send labs with EKG and last note to surgeon.   Cholesterol is elevated. LDL is not to goal. Lifestyle changes can help such as exercise, low fat diet, decrease processed food. Medications can also help as well. Your CV risk is a little higher because you smoke. What are your thoughts on starting a cholesterol medication?

## 2019-06-06 ENCOUNTER — Other Ambulatory Visit: Payer: Self-pay | Admitting: Neurology

## 2019-06-06 DIAGNOSIS — E7800 Pure hypercholesterolemia, unspecified: Secondary | ICD-10-CM

## 2019-06-06 NOTE — Progress Notes (Signed)
Please print and end to [facility] with signed clearance.

## 2019-06-14 DIAGNOSIS — M1611 Unilateral primary osteoarthritis, right hip: Secondary | ICD-10-CM | POA: Diagnosis not present

## 2019-06-20 DIAGNOSIS — M1611 Unilateral primary osteoarthritis, right hip: Secondary | ICD-10-CM | POA: Diagnosis not present

## 2019-06-22 DIAGNOSIS — M25651 Stiffness of right hip, not elsewhere classified: Secondary | ICD-10-CM | POA: Diagnosis not present

## 2019-06-22 DIAGNOSIS — R262 Difficulty in walking, not elsewhere classified: Secondary | ICD-10-CM | POA: Diagnosis not present

## 2019-06-22 DIAGNOSIS — M25551 Pain in right hip: Secondary | ICD-10-CM | POA: Diagnosis not present

## 2019-06-22 DIAGNOSIS — M6281 Muscle weakness (generalized): Secondary | ICD-10-CM | POA: Diagnosis not present

## 2019-06-27 DIAGNOSIS — M25551 Pain in right hip: Secondary | ICD-10-CM | POA: Diagnosis not present

## 2019-06-27 DIAGNOSIS — R262 Difficulty in walking, not elsewhere classified: Secondary | ICD-10-CM | POA: Diagnosis not present

## 2019-06-27 DIAGNOSIS — M6281 Muscle weakness (generalized): Secondary | ICD-10-CM | POA: Diagnosis not present

## 2019-06-27 DIAGNOSIS — M25651 Stiffness of right hip, not elsewhere classified: Secondary | ICD-10-CM | POA: Diagnosis not present

## 2019-07-02 DIAGNOSIS — R262 Difficulty in walking, not elsewhere classified: Secondary | ICD-10-CM | POA: Diagnosis not present

## 2019-07-02 DIAGNOSIS — M25551 Pain in right hip: Secondary | ICD-10-CM | POA: Diagnosis not present

## 2019-07-02 DIAGNOSIS — M25651 Stiffness of right hip, not elsewhere classified: Secondary | ICD-10-CM | POA: Diagnosis not present

## 2019-07-02 DIAGNOSIS — M6281 Muscle weakness (generalized): Secondary | ICD-10-CM | POA: Diagnosis not present

## 2019-07-09 DIAGNOSIS — R262 Difficulty in walking, not elsewhere classified: Secondary | ICD-10-CM | POA: Diagnosis not present

## 2019-07-09 DIAGNOSIS — M25651 Stiffness of right hip, not elsewhere classified: Secondary | ICD-10-CM | POA: Diagnosis not present

## 2019-07-09 DIAGNOSIS — M6281 Muscle weakness (generalized): Secondary | ICD-10-CM | POA: Diagnosis not present

## 2019-07-09 DIAGNOSIS — M25551 Pain in right hip: Secondary | ICD-10-CM | POA: Diagnosis not present

## 2019-07-11 DIAGNOSIS — M25551 Pain in right hip: Secondary | ICD-10-CM | POA: Diagnosis not present

## 2019-07-11 DIAGNOSIS — M25651 Stiffness of right hip, not elsewhere classified: Secondary | ICD-10-CM | POA: Diagnosis not present

## 2019-07-11 DIAGNOSIS — M6281 Muscle weakness (generalized): Secondary | ICD-10-CM | POA: Diagnosis not present

## 2019-07-11 DIAGNOSIS — R262 Difficulty in walking, not elsewhere classified: Secondary | ICD-10-CM | POA: Diagnosis not present

## 2019-07-16 DIAGNOSIS — R262 Difficulty in walking, not elsewhere classified: Secondary | ICD-10-CM | POA: Diagnosis not present

## 2019-07-16 DIAGNOSIS — M25551 Pain in right hip: Secondary | ICD-10-CM | POA: Diagnosis not present

## 2019-07-16 DIAGNOSIS — M6281 Muscle weakness (generalized): Secondary | ICD-10-CM | POA: Diagnosis not present

## 2019-07-16 DIAGNOSIS — M25651 Stiffness of right hip, not elsewhere classified: Secondary | ICD-10-CM | POA: Diagnosis not present

## 2019-07-20 DIAGNOSIS — R262 Difficulty in walking, not elsewhere classified: Secondary | ICD-10-CM | POA: Diagnosis not present

## 2019-07-20 DIAGNOSIS — M25651 Stiffness of right hip, not elsewhere classified: Secondary | ICD-10-CM | POA: Diagnosis not present

## 2019-07-20 DIAGNOSIS — M25551 Pain in right hip: Secondary | ICD-10-CM | POA: Diagnosis not present

## 2019-07-20 DIAGNOSIS — M6281 Muscle weakness (generalized): Secondary | ICD-10-CM | POA: Diagnosis not present

## 2019-07-23 DIAGNOSIS — R262 Difficulty in walking, not elsewhere classified: Secondary | ICD-10-CM | POA: Diagnosis not present

## 2019-07-23 DIAGNOSIS — M25651 Stiffness of right hip, not elsewhere classified: Secondary | ICD-10-CM | POA: Diagnosis not present

## 2019-07-23 DIAGNOSIS — M25551 Pain in right hip: Secondary | ICD-10-CM | POA: Diagnosis not present

## 2019-07-23 DIAGNOSIS — M6281 Muscle weakness (generalized): Secondary | ICD-10-CM | POA: Diagnosis not present

## 2019-07-27 DIAGNOSIS — R262 Difficulty in walking, not elsewhere classified: Secondary | ICD-10-CM | POA: Diagnosis not present

## 2019-07-27 DIAGNOSIS — M6281 Muscle weakness (generalized): Secondary | ICD-10-CM | POA: Diagnosis not present

## 2019-07-27 DIAGNOSIS — M25651 Stiffness of right hip, not elsewhere classified: Secondary | ICD-10-CM | POA: Diagnosis not present

## 2019-07-27 DIAGNOSIS — M25551 Pain in right hip: Secondary | ICD-10-CM | POA: Diagnosis not present

## 2019-09-05 ENCOUNTER — Ambulatory Visit (INDEPENDENT_AMBULATORY_CARE_PROVIDER_SITE_OTHER): Payer: BC Managed Care – PPO

## 2019-09-05 ENCOUNTER — Ambulatory Visit (INDEPENDENT_AMBULATORY_CARE_PROVIDER_SITE_OTHER): Payer: BC Managed Care – PPO | Admitting: Physician Assistant

## 2019-09-05 ENCOUNTER — Encounter: Payer: Self-pay | Admitting: Physician Assistant

## 2019-09-05 ENCOUNTER — Other Ambulatory Visit: Payer: Self-pay

## 2019-09-05 VITALS — BP 109/58 | HR 73 | Ht 68.5 in | Wt 262.0 lb

## 2019-09-05 DIAGNOSIS — S62002A Unspecified fracture of navicular [scaphoid] bone of left wrist, initial encounter for closed fracture: Secondary | ICD-10-CM

## 2019-09-05 DIAGNOSIS — S6992XA Unspecified injury of left wrist, hand and finger(s), initial encounter: Secondary | ICD-10-CM | POA: Diagnosis not present

## 2019-09-05 DIAGNOSIS — M25532 Pain in left wrist: Secondary | ICD-10-CM | POA: Diagnosis not present

## 2019-09-05 DIAGNOSIS — M79645 Pain in left finger(s): Secondary | ICD-10-CM

## 2019-09-05 MED ORDER — TRAMADOL HCL 50 MG PO TABS: 50.0000 mg | ORAL_TABLET | Freq: Four times a day (QID) | ORAL | 0 refills | Status: AC | PRN

## 2019-09-05 NOTE — Addendum Note (Signed)
Addended by: Jomarie Longs on: 09/05/2019 10:31 AM   Modules accepted: Orders, Level of Service

## 2019-09-05 NOTE — Progress Notes (Addendum)
   Subjective:    Patient ID: Gregory Peters, male    DOB: 1971-02-13, 49 y.o.   MRN: [id number]  HPI  Pt is a 49 yo male with recent right total hip replacement(supposed to go back to work Monday) who presents to the clinic with left wrist and thumb pain for the last week. He denies any known injury but right before the pain started he was playing softball with his daughter. The pain started suddenly and woke him up in the middle of the night. He is wearing a wrist brace which helps some. He took a few doses of ibuprofen which helps.    .. Active Ambulatory Problems    Diagnosis Date Noted  . Crushing injury of right index finger 08/16/2013  . Tobacco dependence 05/20/2014  . Obese 05/20/2014  . Other fatigue 05/20/2014  . Right-sided low back pain without sciatica 10/15/2015  . Elbow injury, right, initial encounter 07/30/2016  . Rupture of distal biceps tendon 08/02/2016  . Primary osteoarthritis of right hip 05/01/2019  . Spondylolisthesis at L5-S1 level 05/01/2019  . Closed displaced fracture of scaphoid bone of left wrist 09/05/2019   Resolved Ambulatory Problems    Diagnosis Date Noted  . No Resolved Ambulatory Problems   No Additional Past Medical History      Review of Systems See HPI.     Objective:   Physical Exam Vitals reviewed.  Constitutional:      Appearance: Normal appearance.  HENT:     Head: Normocephalic and atraumatic.  Cardiovascular:     Rate and Rhythm: Normal rate.  Pulmonary:     Effort: Pulmonary effort is normal.  Musculoskeletal:     Comments: Left wrist and thumb:   Tenderness over radial wrist, CMC joint, anatomical snuff box to palpation.  Hand grip 4/5.  Positive finkelstein.  Pain with flexion and extension of thumb.  Strength decreased with resisted flexion and extension of thumb.  Sensation intact.   Neurological:     Mental Status: He is alert and oriented to person, place, and time.  Psychiatric:        Mood and Affect: Mood  normal.           Assessment & Plan:  [name] KitchenMarland KitchenKailash was seen today for wrist pain.  Diagnoses and all orders for this visit:  Closed displaced fracture of scaphoid of left wrist, unspecified portion of scaphoid, initial encounter  Left wrist pain -     DG Hand Complete Left -     DG Wrist Complete Left  Pain of left thumb -     DG Hand Complete Left -     DG Wrist Complete Left  Other orders -     traMADol (ULTRAM) 50 MG tablet; Take 1 tablet (50 mg total) by mouth every 6 (six) hours as needed for up to 5 days.   Need xrays to confirm no fracture. He does have a lot of bony tenderness over scaphoid bone.  Could be tenosynovitis. Ibuprofen 800mg  TID.  Ice regularly.  Change wrist splint to wrist splint with thumb spica.   Addendum:  Imaging Results:   Probable moderately displaced scaphoid fracture of indeterminate age. CT scan is recommended for further evaluation.  Consulted Dr. . Will get CT for follow up and get him appt with Dr. Karie Schwalbe after CT scan.   Tramadol given for pain.  Karie Schwalbe.PDMP not reviewed this encounter.

## 2019-09-05 NOTE — Progress Notes (Signed)
Looks like scaphoid fracture. Would you take a look? Do you think he needs the CT to better evaluate or can you just splint him and do follow up imaging?

## 2019-09-05 NOTE — Patient Instructions (Signed)
De Quervain's Tenosynovitis  De Quervain's tenosynovitis is a condition that causes inflammation of the tendon on the thumb side of the wrist. Tendons are cords of tissue that connect bones to muscles. The tendons in the hand pass through a tunnel called a sheath. A slippery layer of tissue (synovium) lets the tendons move smoothly in the sheath. With de Quervain's tenosynovitis, the sheath swells or thickens, causing friction and pain. The condition is also called de Quervain's disease and de Quervain's syndrome. It occurs most often in women who are 30-50 years old. What are the causes? The exact cause of this condition is not known. It may be associated with overuse of the hand and wrist. What increases the risk? You are more likely to develop this condition if you:  Use your hands far more than normal, especially if you repeat certain movements that involve twisting your hand or using a tight grip.  Are pregnant.  Are a middle-aged woman.  Have rheumatoid arthritis.  Have diabetes. What are the signs or symptoms? The main symptom of this condition is pain on the thumb side of the wrist. The pain may get worse when you grasp something or turn your wrist. Other symptoms may include:  Pain that extends up the forearm.  Swelling of your wrist and hand.  Trouble moving the thumb and wrist.  A sensation of snapping in the wrist.  A bump filled with fluid (cyst) in the area of the pain. How is this diagnosed? This condition may be diagnosed based on:  Your symptoms and medical history.  A physical exam. During the exam, your health care provider may do a simple test (Finkelstein test) that involves pulling your thumb and wrist to see if this causes pain. You may also need to have an X-ray. How is this treated? Treatment for this condition may include:  Avoiding any activity that causes pain and swelling.  Taking medicines. Anti-inflammatory medicines and corticosteroid  injections may be used to reduce inflammation and relieve pain.  Wearing a splint.  Having surgery. This may be needed if other treatments do not work. Once the pain and swelling has gone down:  Physical therapy. This includes stretching and strengthening exercises.  Occupational therapy. This includes adjusting how you move your wrist. Follow these instructions at home: If you have a splint:  Wear the splint as told by your health care provider. Remove it only as told by your health care provider.  Loosen the splint if your fingers tingle, become numb, or turn cold and blue.  Keep the splint clean.  If the splint is not waterproof: ? Do not let it get wet. ? Cover it with a watertight covering when you take a bath or a shower. Managing pain, stiffness, and swelling   Avoid movements and activities that cause pain and swelling in the wrist area.  If directed, put ice on the painful area. This may be helpful after doing activities that involve the sore wrist. ? Put ice in a plastic bag. ? Place a towel between your skin and the bag. ? Leave the ice on for 20 minutes, 2-3 times a day.  Move your fingers often to avoid stiffness and to lessen swelling.  Raise (elevate) the injured area above the level of your heart while you are sitting or lying down. General instructions  Return to your normal activities as told by your health care provider. Ask your health care provider what activities are safe for you.  Take over-the-counter   and prescription medicines only as told by your health care provider.  Keep all follow-up visits as told by your health care provider. This is important. Contact a health care provider if:  Your pain medicine does not help.  Your pain gets worse.  You develop new symptoms. Summary  De Quervain's tenosynovitis is a condition that causes inflammation of the tendon on the thumb side of the wrist.  The condition occurs most often in women who are  30-50 years old.  The exact cause of this condition is not known. It may be associated with overuse of the hand and wrist.  Treatment starts with avoiding activity that causes pain or swelling in the wrist area. Other treatment may include wearing a splint and taking medicine. Sometimes, surgery is needed. This information is not intended to replace advice given to you by your health care provider. Make sure you discuss any questions you have with your health care provider. Document Revised: 12/22/2017 Document Reviewed: 05/30/2017 Elsevier Patient Education  2020 [employer]vier Inc.  

## 2019-09-05 NOTE — Progress Notes (Signed)
CT ordered and placed on scheduled Friday! Your the best.

## 2019-09-06 DIAGNOSIS — S62001A Unspecified fracture of navicular [scaphoid] bone of right wrist, initial encounter for closed fracture: Secondary | ICD-10-CM | POA: Diagnosis not present

## 2019-09-06 DIAGNOSIS — Z9889 Other specified postprocedural states: Secondary | ICD-10-CM | POA: Diagnosis not present

## 2019-09-07 ENCOUNTER — Ambulatory Visit: Payer: BC Managed Care – PPO | Admitting: Sports Medicine

## 2019-09-11 DIAGNOSIS — M25532 Pain in left wrist: Secondary | ICD-10-CM | POA: Diagnosis not present

## 2019-09-18 DIAGNOSIS — S62022K Displaced fracture of middle third of navicular [scaphoid] bone of left wrist, subsequent encounter for fracture with nonunion: Secondary | ICD-10-CM | POA: Diagnosis not present

## 2019-10-17 DIAGNOSIS — S62022K Displaced fracture of middle third of navicular [scaphoid] bone of left wrist, subsequent encounter for fracture with nonunion: Secondary | ICD-10-CM | POA: Diagnosis not present

## 2021-08-08 IMAGING — DX DG LUMBAR SPINE COMPLETE 4+V
5 series · 5 of 5 positions shown · non-contrast
Comparison: None.

CLINICAL DATA: Low back pain with right-sided radicular symptoms

EXAM:
LUMBAR SPINE - COMPLETE 4+ VIEW

[l-spine ap]
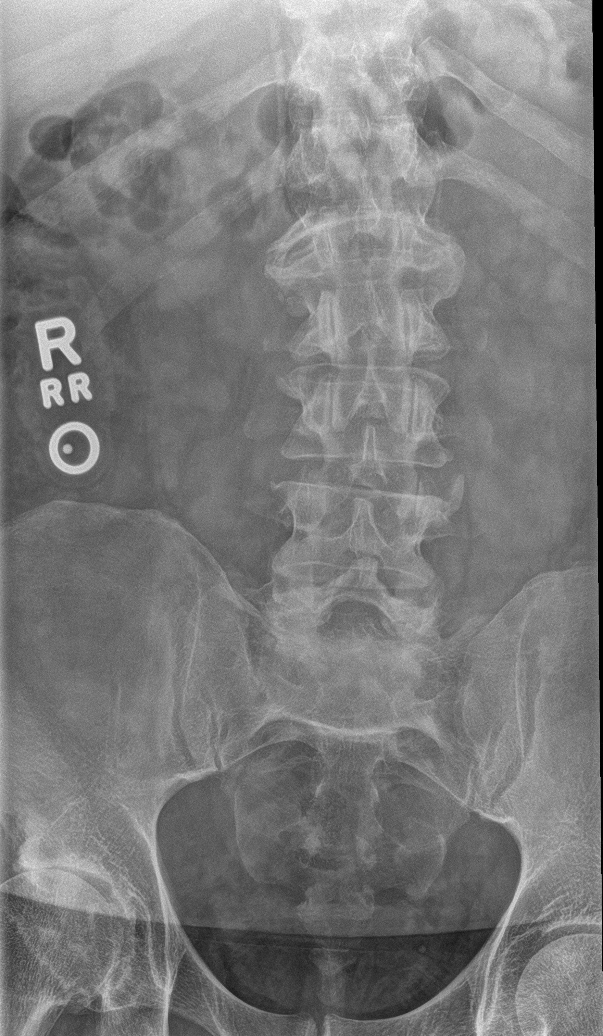

[l-spine obl (1 of 2)]
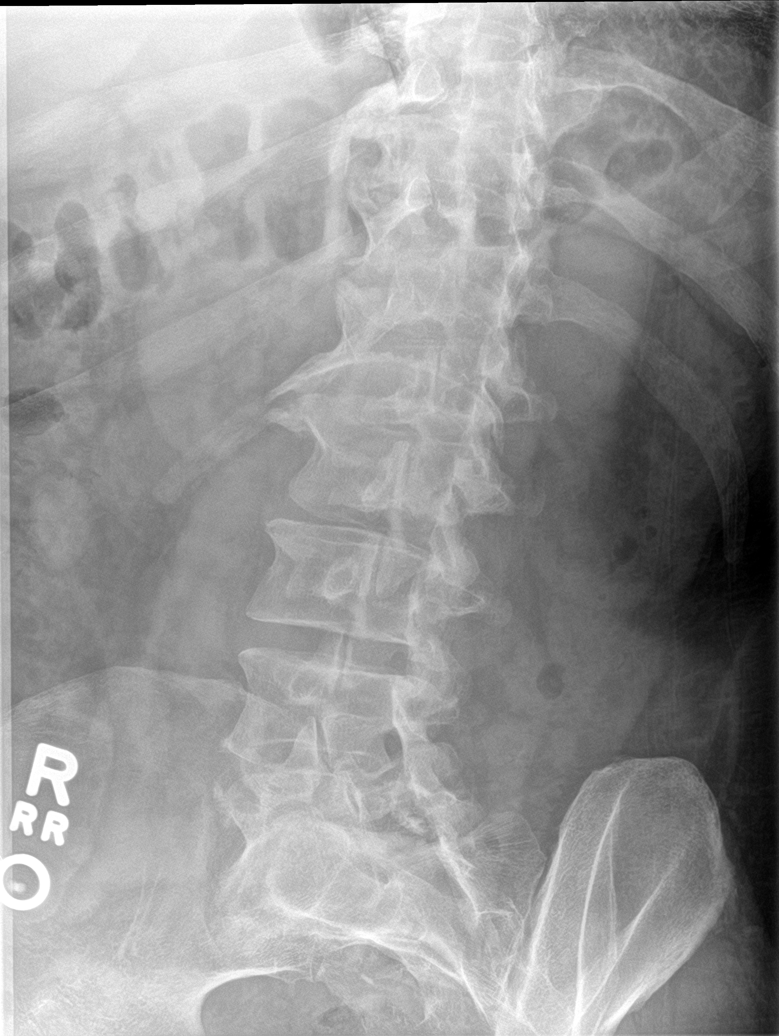

[l-spine obl (2 of 2)]
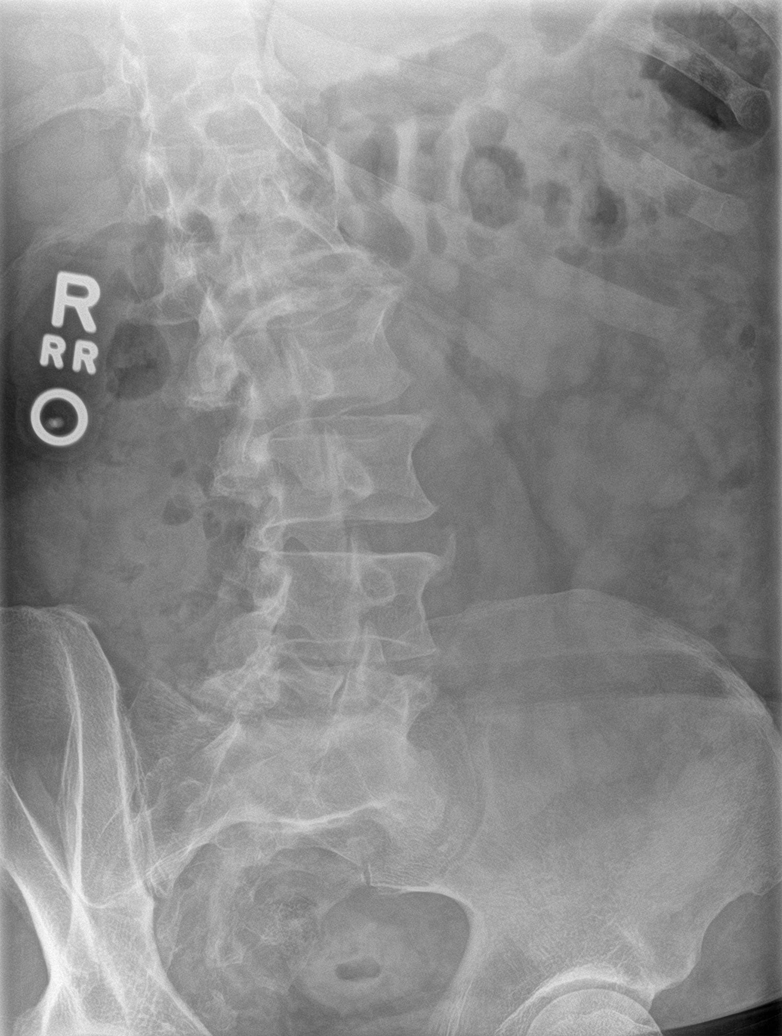

[l-spine lat]
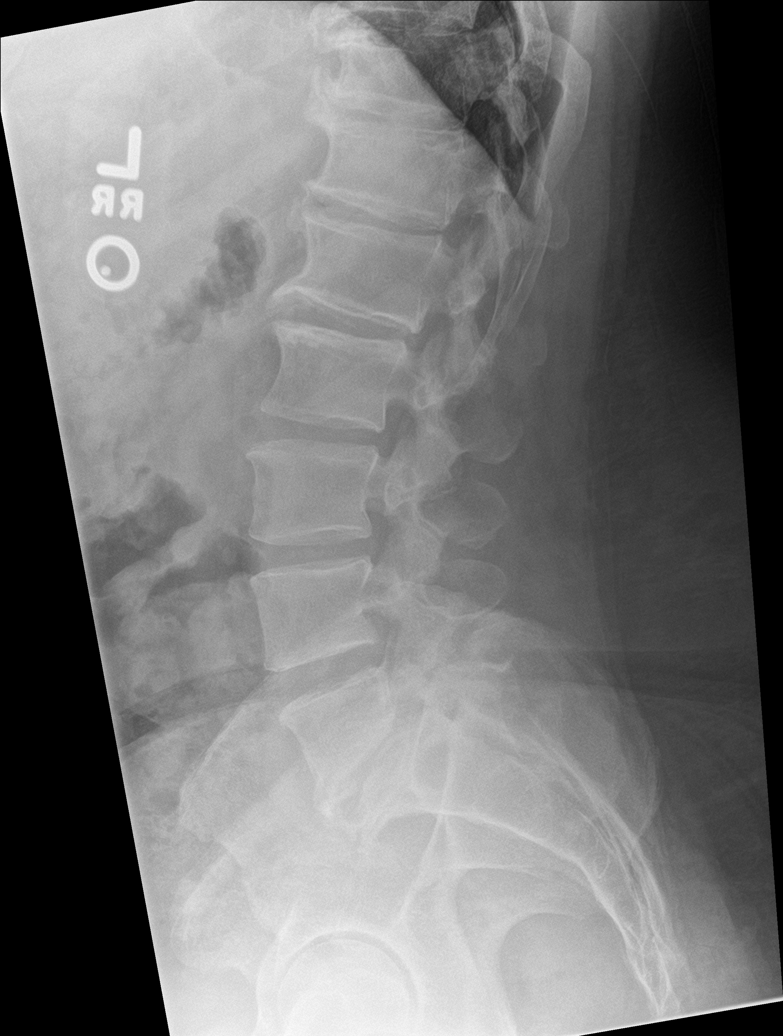

[l-spine spot]
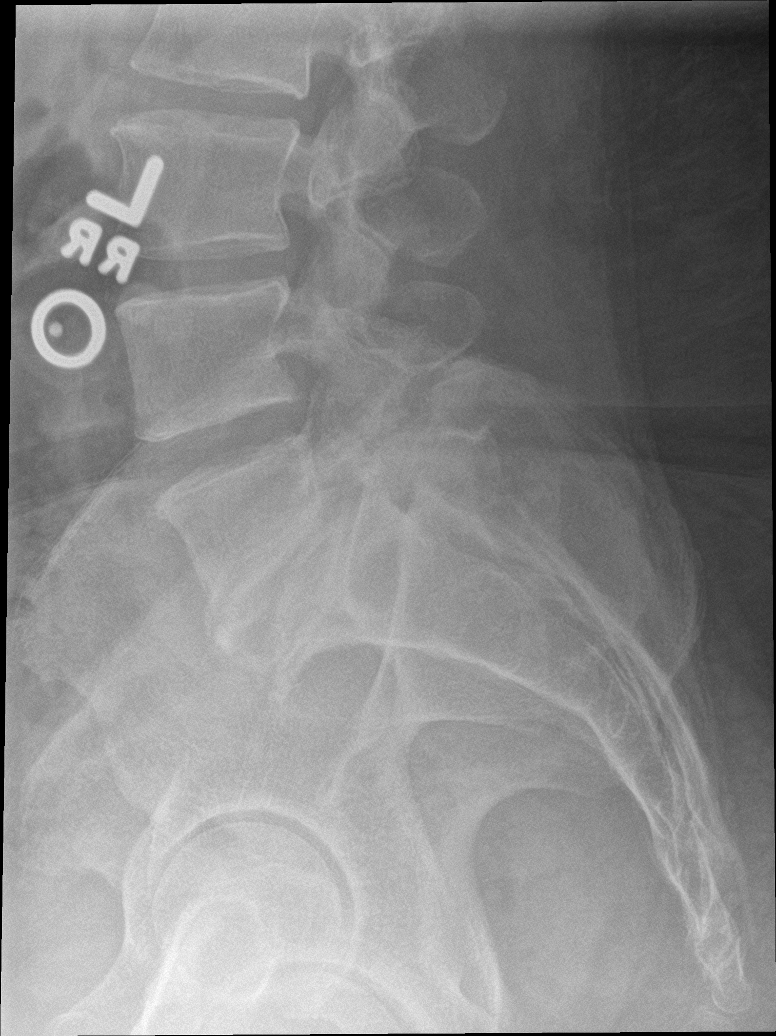

[5 of 5 positions shown; findings below may reference images not displayed]

FINDINGS: Frontal, lateral, spot lumbosacral lateral, and bilateral oblique
views were obtained. There are 5 non-rib-bearing lumbar type
vertebral bodies. There is no lumbar fracture. There is anterior
wedging at T11. There is 1.5 cm of anterolisthesis of L5 on S1.
There are pars defects at L5 bilaterally. There is no other
spondylolisthesis. There is marked disc space narrowing at L5-S1.
There is mild disc space narrowing at L1-2 with moderate disc space
narrowing at T11-12 and T12-L1. Other disc spaces appear
unremarkable. There is no appreciable facet arthropathy.
IMPRESSION: Pars defects at L5 bilaterally with 1.5 cm of anterolisthesis of L5
on S1. No other spondylolisthesis. Anterior wedging at T11. No
lumbar fracture. There is marked disc space narrowing at L5-S1.
There is moderate disc space narrowing at T11-12 T12-L1 and mild
disc space narrowing at L1-2.

## 2024-01-03 ENCOUNTER — Ambulatory Visit: Payer: Self-pay | Admitting: *Deleted

## 2024-01-03 NOTE — Telephone Encounter (Signed)
   Patient/caregiver understands and will follow disposition?: FYI Only or Action Required?: FYI only for provider.  Patient was last seen in primary care on 09/05/2019 . Called Nurse Triage reporting Rash. Symptoms began several months ago. Interventions attempted: OTC medications: anti itch cream . Symptoms are: gradually worsening.  Triage Disposition: Call PCP Within 24 Hours  Patient/caregiver understands and will follow disposition?: recommended UC or mobile unit                   Copied from CRM [id number]. Topic: Clinical - Pink Word Triage >> Jan 03, 2024  8:19 AM Farrel B wrote: [facility] that prompted transfer to Nurse Triage: Patient states he is unsure if he's having an allergic reaction to something but theres a rash on his left inner foot that is not getting any better and needed to know what to do. At this time there's no available apt. >> Jan 03, 2024  8:31 AM Alfonso ORN wrote: Patient called back from been disconnected have a rash on right foot starting to spread all over his feet  thinking its eczema  Call back number [phone]  Reason for Disposition  [1] Applying cream or ointment AND [2] causes severe itch, burning or pain  Answer Assessment - Initial Assessment Questions 1. APPEARANCE of RASH: Describe the rash.      Right foot left inner area red, skin peeling every time sock removed 2. LOCATION: Where is the rash located?      Right foot , left inner area of foot 3. NUMBER: How many spots are there?      One  4. SIZE: How big are the spots? (Inches, centimeters or compare to size of a coin)      Size of palm  5. ONSET: When did the rash start?      On going for a long time 6. ITCHING: Does the rash itch? If Yes, ask: How bad is the itch?  (Scale 0-10; or none, mild, moderate, severe)     Yes trying anti itch OTC  7. PAIN: Does the rash hurt? If Yes, ask: How bad is the pain?  (Scale 0-10; or none, mild, moderate, severe)    -  NONE (0): no pain    - MILD (1-3): doesn't interfere with normal activities     - MODERATE (4-7): interferes with normal activities or awakens from sleep     - SEVERE (8-10): excruciating pain, unable to do any normal activities     No pain walking stretching foot yes  8. OTHER SYMPTOMS: Do you have any other symptoms? (e.g., fever)     Na  9. PREGNANCY: Is there any chance you are pregnant? When was your last menstrual period?     Na   Recommended UC or mobile bus for evaluation of rash/ wound. Patient has not been seen at [facility] 3 years and must be established as new patient. Patient declined to schedule new patient appt.  Protocols used: Rash or Redness - Localized-A-AH
